# Patient Record
Sex: Female | Born: 1970 | Race: White | Hispanic: No | Marital: Married | Smoking: Never smoker
Health system: Southern US, Community
[De-identification: ages and names within clinical notes are randomized; demographics above are authoritative.]

## PROBLEM LIST (undated history)

## (undated) DIAGNOSIS — F319 Bipolar disorder, unspecified: Secondary | ICD-10-CM

## (undated) DIAGNOSIS — G4700 Insomnia, unspecified: Secondary | ICD-10-CM

## (undated) DIAGNOSIS — K219 Gastro-esophageal reflux disease without esophagitis: Secondary | ICD-10-CM

## (undated) DIAGNOSIS — R609 Edema, unspecified: Secondary | ICD-10-CM

## (undated) DIAGNOSIS — R519 Headache, unspecified: Secondary | ICD-10-CM

## (undated) DIAGNOSIS — Z8489 Family history of other specified conditions: Secondary | ICD-10-CM

## (undated) HISTORY — PX: CHOLECYSTECTOMY: SHX55

## (undated) HISTORY — DX: Insomnia, unspecified: G47.00

## (undated) HISTORY — PX: TUBAL LIGATION: SHX77

---

## 2000-06-05 HISTORY — PX: PLACEMENT OF BREAST IMPLANTS: SHX6334

## 2017-03-24 ENCOUNTER — Emergency Department (HOSPITAL_COMMUNITY): Payer: PRIVATE HEALTH INSURANCE

## 2017-03-24 ENCOUNTER — Encounter (HOSPITAL_COMMUNITY): Payer: Self-pay

## 2017-03-24 ENCOUNTER — Emergency Department (HOSPITAL_COMMUNITY)
Admission: EM | Admit: 2017-03-24 | Discharge: 2017-03-24 | Disposition: A | Discharge status: Home / Self Care | Payer: PRIVATE HEALTH INSURANCE | Attending: Emergency Medicine | Admitting: Emergency Medicine

## 2017-03-24 DIAGNOSIS — R079 Chest pain, unspecified: Secondary | ICD-10-CM | POA: Diagnosis present

## 2017-03-24 DIAGNOSIS — R531 Weakness: Secondary | ICD-10-CM | POA: Diagnosis not present

## 2017-03-24 DIAGNOSIS — R51 Headache: Secondary | ICD-10-CM | POA: Insufficient documentation

## 2017-03-24 DIAGNOSIS — Z79899 Other long term (current) drug therapy: Secondary | ICD-10-CM | POA: Diagnosis not present

## 2017-03-24 DIAGNOSIS — F419 Anxiety disorder, unspecified: Secondary | ICD-10-CM | POA: Insufficient documentation

## 2017-03-24 DIAGNOSIS — R0602 Shortness of breath: Secondary | ICD-10-CM | POA: Insufficient documentation

## 2017-03-24 HISTORY — DX: Bipolar disorder, unspecified: F31.9

## 2017-03-24 LAB — D-DIMER, QUANTITATIVE (NOT AT ARMC): D DIMER QUANT: 0.32 ug{FEU}/mL (ref 0.00–0.50)

## 2017-03-24 LAB — I-STAT TROPONIN, ED
TROPONIN I, POC: 0 ng/mL (ref 0.00–0.08)
Troponin i, poc: 0 ng/mL (ref 0.00–0.08)

## 2017-03-24 LAB — BASIC METABOLIC PANEL
ANION GAP: 10 (ref 5–15)
BUN: 10 mg/dL (ref 6–20)
CALCIUM: 8.3 mg/dL — AB (ref 8.9–10.3)
CO2: 25 mmol/L (ref 22–32)
Chloride: 100 mmol/L — ABNORMAL LOW (ref 101–111)
Creatinine, Ser: 0.9 mg/dL (ref 0.44–1.00)
GLUCOSE: 97 mg/dL (ref 65–99)
POTASSIUM: 3.5 mmol/L (ref 3.5–5.1)
SODIUM: 135 mmol/L (ref 135–145)

## 2017-03-24 LAB — CBC
HEMATOCRIT: 41.3 % (ref 36.0–46.0)
HEMOGLOBIN: 14 g/dL (ref 12.0–15.0)
MCH: 30.4 pg (ref 26.0–34.0)
MCHC: 33.9 g/dL (ref 30.0–36.0)
MCV: 89.6 fL (ref 78.0–100.0)
Platelets: 347 10*3/uL (ref 150–400)
RBC: 4.61 MIL/uL (ref 3.87–5.11)
RDW: 13 % (ref 11.5–15.5)
WBC: 7.4 10*3/uL (ref 4.0–10.5)

## 2017-03-24 MED ORDER — RANITIDINE HCL 150 MG PO TABS: 150.0000 mg | ORAL_TABLET | Freq: Two times a day (BID) | ORAL | 0 refills | Status: DC

## 2017-03-24 MED ORDER — GI COCKTAIL ~~LOC~~
30.0000 mL | Freq: Once | ORAL | Status: AC
  Administered 2017-03-24: 30 mL via ORAL
  Filled 2017-03-24: qty 30

## 2017-03-24 NOTE — ED Provider Notes (Signed)
[facility] EMERGENCY DEPARTMENT Provider Note   CSN: [id number] Arrival date & time: 03/24/17  1155     History   Chief Complaint Chief Complaint  Patient presents with  . Chest Pain    HPI Michaela Patton is a 46 y.o. female.  46yo F w/ PMH including HLD who p/w chest pain. Patient states that for the past one week she has been having intermittent problems with indigestion which she describes as a constant burning that seems worse in the morning after she gets to work, not associated with any nausea or vomiting. She has taken Tums multiple times without much relief. Todayshe woke up with a headache and later had a gradual onset of central chest pain that she describes as sharp, relatively constant, radiating up into her neck and occasionally into her scapula. The pain lasted about an hour and then resolved. It then started again just before lunch time which is what prompted her to come in. She reports some mild associated shortness of breath and generalized weakness. She states that the pain is not severe right now but she just feels sore and doesn't feel right. She denies any abdominal pain. No cough, cold, or recent illness. She describes her current discomfort as a fullness sore achy feeling. Family history notable for grandparents with heart disease. She denies anysmoking or alcohol use.   The history is provided by the patient.    Past Medical History:  Diagnosis Date  . Bipolar 1 disorder ([place])     There are no active problems to display for this patient.   Past Surgical History:  Procedure Laterality Date  . CHOLECYSTECTOMY    . TUBAL LIGATION      OB History    No data available       Home Medications    Prior to Admission medications   Medication Sig Start Date End Date Taking? Authorizing Provider  atorvastatin (LIPITOR) 80 MG tablet Take 80 mg by mouth daily. 04/25/16  Yes [provider]  dicyclomine (BENTYL) 20 MG tablet Take  20 mg by mouth every 8 (eight) hours as needed for spasms.  06/15/16  Yes [provider]  furosemide (LASIX) 40 MG tablet Take 40 mg by mouth 2 (two) times daily. 02/28/17  Yes [provider]  LORazepam (ATIVAN) 2 MG tablet Take 2 mg by mouth 2 (two) times daily. 01/19/17  Yes [provider]  potassium chloride SA (KLOR-CON M20) 20 MEQ tablet Take 20 mEq by mouth 3 (three) times daily. 05/19/16  Yes [provider]  QUEtiapine (SEROQUEL) 200 MG tablet Take 600 mg by mouth at bedtime. 01/13/17  Yes [provider]  temazepam (RESTORIL) 30 MG capsule Take 30 mg by mouth at bedtime. 02/16/17  Yes [provider]  Vitamin D, Ergocalciferol, (DRISDOL) 50000 units CAPS capsule Take 50,000 Units by mouth See admin instructions. Take 1 capsule (50,000 units) by mouth every Michaela Patton. 02/08/17  Yes [provider]  ranitidine (ZANTAC) 150 MG tablet Take 1 tablet (150 mg total) by mouth 2 (two) times daily. 03/24/17   Jermani Eberlein, Wenda Overland, MD    Family History History reviewed. No pertinent family history.  Social History Social History  Substance Use Topics  . Smoking status: Never Smoker  . Smokeless tobacco: Never Used  . Alcohol use Not on file     Allergies   Ciprofloxacin; Codeine; Penicillins; and Sulfa antibiotics   Review of Systems Review of Systems All other systems reviewed  and are negative except that which was mentioned in HPI   Physical Exam Updated Vital Signs BP 115/82   Pulse 80   Temp 98.2 F (36.8 C) (Oral)   Resp 13   Ht 5\' 4"  (1.626 m)   Wt 85.7 kg (189 lb)   LMP 03/05/2017 (Approximate) Comment: tubal ligation  SpO2 97%   BMI 32.44 kg/m   Physical Exam  Constitutional: She is oriented to person, place, and time. She appears well-developed and well-nourished. No distress.  HENT:  Head: Normocephalic and atraumatic.  Moist mucous membranes  Eyes: Pupils are equal, round, and reactive to light.  Conjunctivae are normal.  Neck: Neck supple.  Cardiovascular: Normal rate, regular rhythm and normal heart sounds.   No murmur heard. Pulmonary/Chest: Effort normal and breath sounds normal.  Abdominal: Soft. Bowel sounds are normal. She exhibits no distension. There is no tenderness.  Musculoskeletal: She exhibits no edema.  Neurological: She is alert and oriented to person, place, and time.  Fluent speech  Skin: Skin is warm and dry.  Psychiatric: Judgment normal.  Mildly anxious  Nursing note and vitals reviewed.    ED Treatments / Results  Labs (all labs ordered are listed, but only abnormal results are displayed) Labs Reviewed  BASIC METABOLIC PANEL - Abnormal; Notable for the following:       Result Value   Chloride 100 (*)    Calcium 8.3 (*)    All other components within normal limits  CBC  D-DIMER, QUANTITATIVE (NOT AT [facility])  I-STAT TROPONIN, ED  I-STAT TROPONIN, ED    EKG  EKG Interpretation  Date/Time:  Saturday March 24 2017 12:02:14 EDT Ventricular Rate:  101 PR Interval:  120 QRS Duration: 82 QT Interval:  358 QTC Calculation: 464 R Axis:   39 Text Interpretation:  Sinus tachycardia Otherwise normal ECG No previous ECGs available Confirmed by Theotis Burrow [phone]) on 03/24/2017 1:28:57 PM       Radiology Dg Chest 2 View  Result Date: 03/24/2017 CLINICAL DATA:  Pain mimicking acid reflux, for 3 weeks. Headache, and chest pain. EXAM: CHEST  2 VIEW COMPARISON:  None. FINDINGS: The heart size and mediastinal contours are within normal limits. Both lungs are clear. The visualized skeletal structures are unremarkable. IMPRESSION: No active cardiopulmonary disease. Electronically Signed   By: Staci Righter M.D.   On: 03/24/2017 12:55    Procedures Procedures (including critical care time)  Medications Ordered in ED Medications  gi cocktail (Maalox,Lidocaine,Donnatal) (30 mLs Oral Given 03/24/17 0000)     Initial Impression / Assessment and Plan  / ED Course  I have reviewed the triage vital signs and the nursing notes.  Pertinent labs & imaging results that were available during my care of the patient were reviewed by me and considered in my medical decision making (see chart for details).     PT w/ 1 week of central burning, chest pain and today sharp chest pain. Her symptoms have been nonexertional. On exam, she was nontoxic and in no acute distress. Vital signs unremarkable. Her EKG showed no evidence of acute ischemia. She had negative serial troponins, normal d-dimer making PE extremely unlikely especially given that she has no risk factors for PE. CXR normal. No widened mediastinum and pain was gradually worsening, now improved without intervention, thus I doubt dissection. Her HEART score is 1 to 2, thus I feel she is appropriate for close outpatient f/u and consideration of stress testing. ecause of her week of symptoms leading up  to today, I did recommend that she start Zantac Extensively reviewed return precautions including any sudden change in symptoms or new symptoms associated with her pain, or for any exertional symptoms. Patient voiced understanding and was discharged in satisfactory condition.  Final Clinical Impressions(s) / ED Diagnoses   Final diagnoses:  Chest pain, unspecified type    New Prescriptions New Prescriptions   RANITIDINE (ZANTAC) 150 MG TABLET    Take 1 tablet (150 mg total) by mouth 2 (two) times daily.     Selinda Korzeniewski, Wenda Overland, MD 03/24/17 [phone]

## 2017-03-24 NOTE — ED Triage Notes (Signed)
Pt presents with indigestion x 1 week and mid-sternal chest pressure that began suddenly today.  Pt reports pain is constant, radiates to mid-scapular area and into her neck.  +shortness of breath, reports weakness

## 2017-11-18 IMAGING — DX DG CHEST 2V
2 series · 2 of 2 positions shown · non-contrast
Comparison: None.

CLINICAL DATA: Pain mimicking acid reflux, for 3 weeks. Headache,
and chest pain.

EXAM:
CHEST  2 VIEW

[w chest pa]
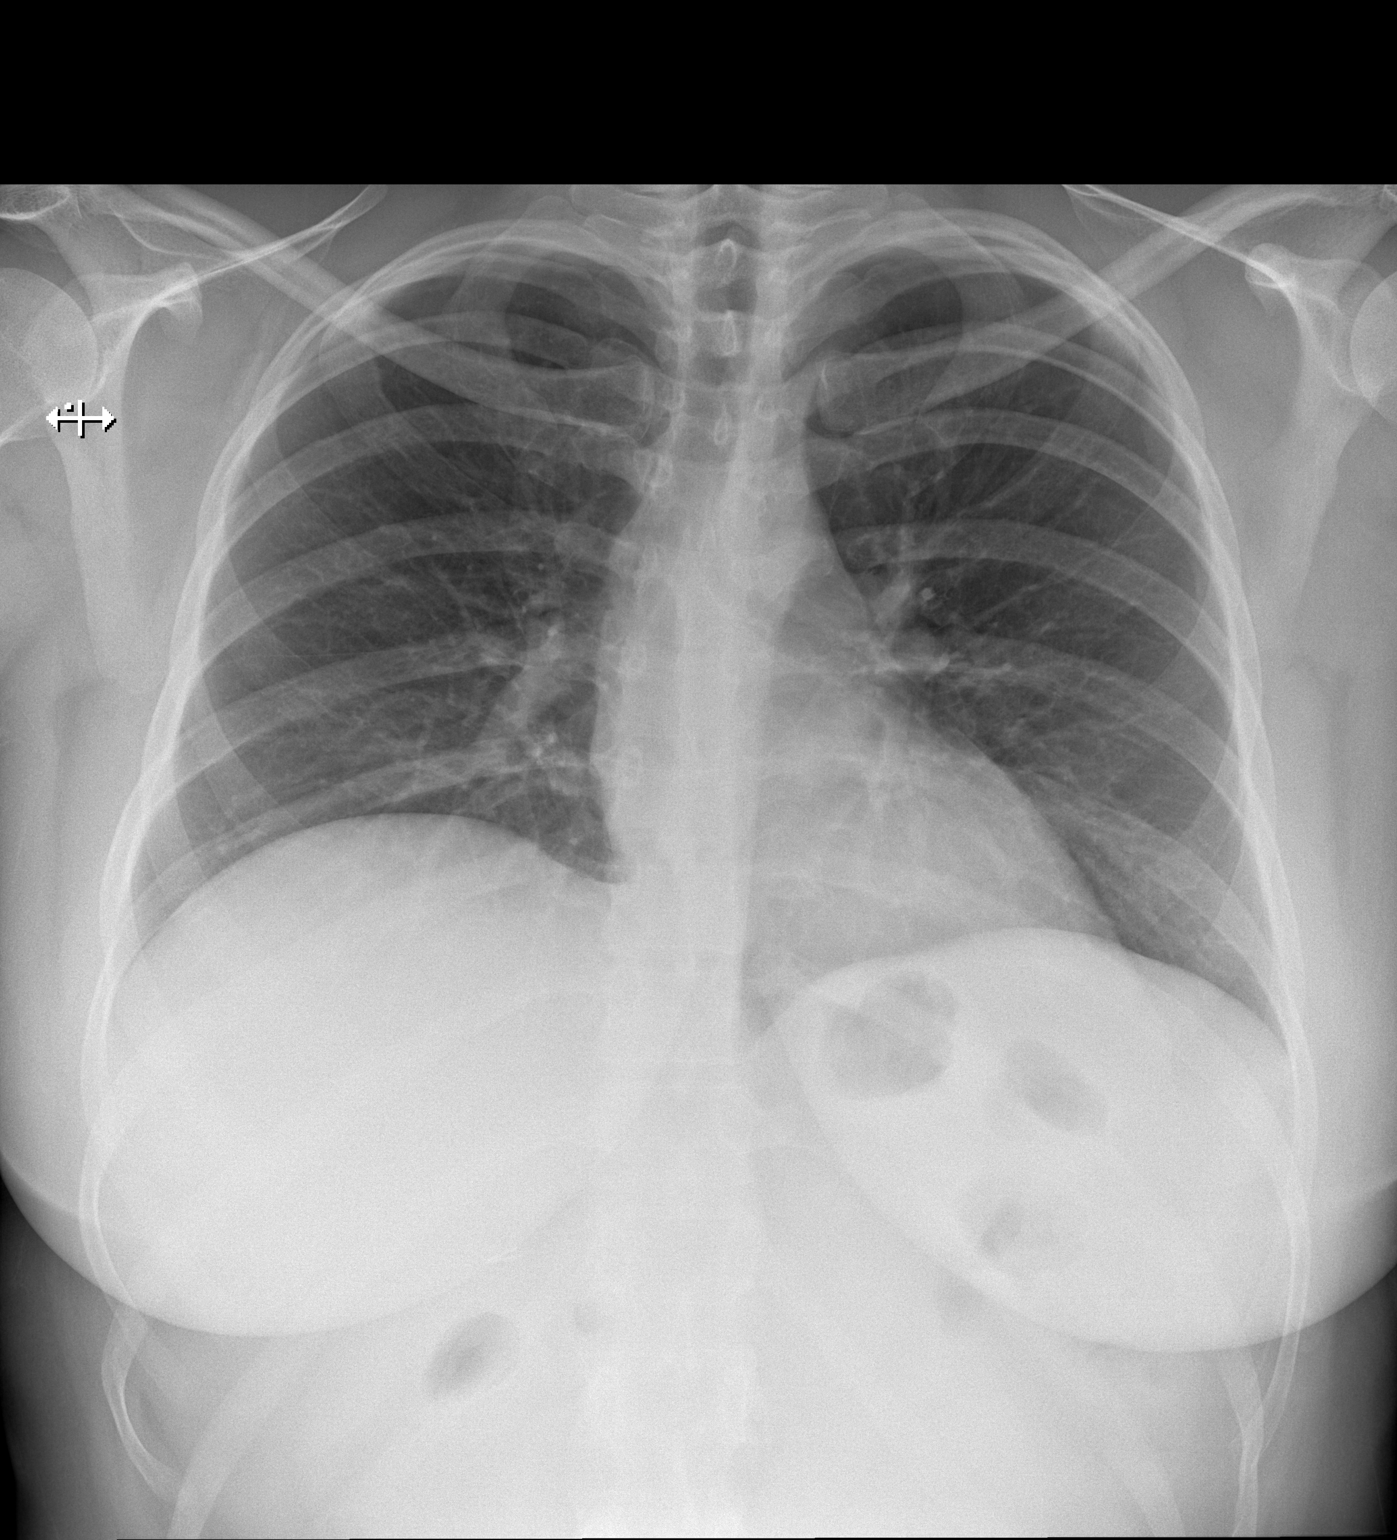

[w chest lat]
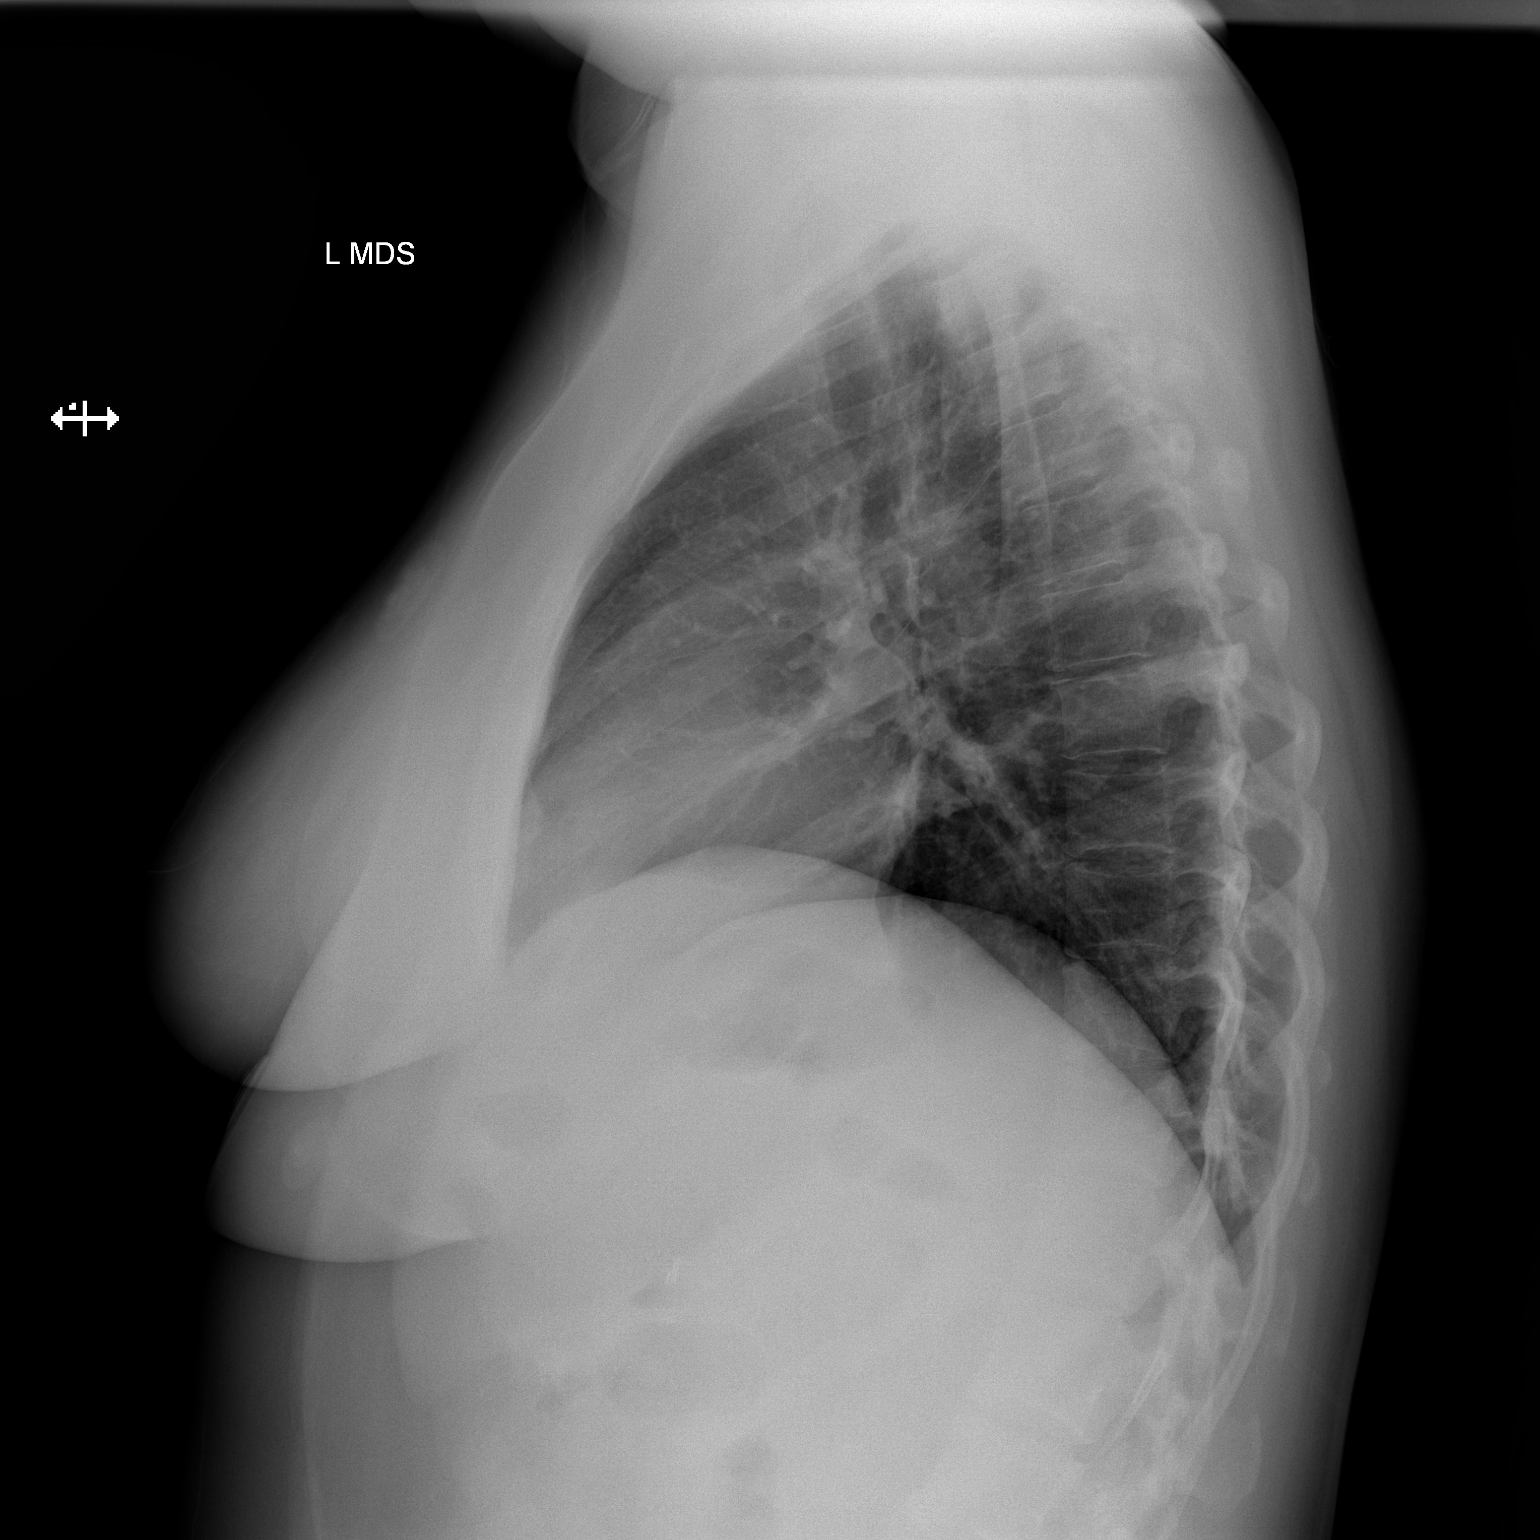

[2 of 2 positions shown; findings below may reference images not displayed]

FINDINGS: The heart size and mediastinal contours are within normal limits.
Both lungs are clear. The visualized skeletal structures are
unremarkable.
IMPRESSION: No active cardiopulmonary disease.

## 2018-12-18 ENCOUNTER — Telehealth: Payer: Self-pay | Admitting: *Deleted

## 2018-12-18 NOTE — Telephone Encounter (Signed)
Called patient back to schedule an appointment.

## 2019-01-09 ENCOUNTER — Encounter: Payer: Self-pay | Admitting: Obstetrics & Gynecology

## 2019-01-09 ENCOUNTER — Other Ambulatory Visit: Payer: Self-pay

## 2019-01-09 ENCOUNTER — Ambulatory Visit (INDEPENDENT_AMBULATORY_CARE_PROVIDER_SITE_OTHER): Payer: PRIVATE HEALTH INSURANCE | Admitting: Obstetrics & Gynecology

## 2019-01-09 VITALS — BP 124/91 | HR 93 | Ht 64.0 in | Wt 195.0 lb

## 2019-01-09 DIAGNOSIS — Z1151 Encounter for screening for human papillomavirus (HPV): Secondary | ICD-10-CM | POA: Diagnosis not present

## 2019-01-09 DIAGNOSIS — Z124 Encounter for screening for malignant neoplasm of cervix: Secondary | ICD-10-CM

## 2019-01-09 DIAGNOSIS — Z01419 Encounter for gynecological examination (general) (routine) without abnormal findings: Secondary | ICD-10-CM | POA: Diagnosis not present

## 2019-01-09 DIAGNOSIS — N920 Excessive and frequent menstruation with regular cycle: Secondary | ICD-10-CM

## 2019-01-09 NOTE — Progress Notes (Signed)
Subjective:    Michaela Patton is a 48 y.o. married P2 (55 and 32 yo kids)female who presents for an annual exam. She reports progressively painful periods for the last year. Bleeding has become much heavier as well, has to wear pads and tampon at the same time. The patient is sexually active. GYN screening history: last pap: was normal. The patient wears seatbelts: yes. The patient participates in regular exercise: no. Has the patient ever been transfused or tattooed?: yes. The patient reports that there is not domestic violence in her life.   Menstrual History: OB History    Gravida  2   Para  2   Term  2   Preterm      AB      Living  2     SAB      TAB      Ectopic      Multiple      Live Births              Menarche age: 70 Patient's last menstrual period was 12/19/2018 (approximate).    The following portions of the patient's history were reviewed and updated as appropriate: allergies, current medications, past family history, past medical history, past social history, past surgical history and problem list.  Review of Systems Pertinent items are noted in HPI.   Sees Dr. Augusto Gamble for thyroid disease, last checked 5/20 Married for 24 years S/p BTL at age 20 yo Works for the [employer] office [employer]- + breast cancer([place])  in her 22 yo sister, negative BRCA,  Mammogram UTD  Objective:    BP (!) 124/91   Pulse 93   Ht _0  (1.626 m)   Wt 195 lb (88.5 kg)   LMP 12/19/2018 (Approximate)   BMI 33.47 kg/m   General Appearance:    Alert, cooperative, no distress, appears stated age  Head:    Normocephalic, without obvious abnormality, atraumatic  Eyes:    PERRL, conjunctiva/corneas clear, EOM's intact, fundi    benign, both eyes  Ears:    Normal TM's and external ear canals, both ears  Nose:   Nares normal, septum midline, mucosa normal, no drainage    or sinus tenderness  Throat:   Lips, mucosa, and tongue normal; teeth and gums normal  Neck:    Supple, symmetrical, trachea midline, no adenopathy;    thyroid:  no enlargement/tenderness/nodules; no carotid   bruit or JVD  Back:     Symmetric, no curvature, ROM normal, no CVA tenderness  Lungs:     Clear to auscultation bilaterally, respirations unlabored  Chest Wall:    No tenderness or deformity   Heart:    Regular rate and rhythm, S1 and S2 normal, no murmur, rub   or gallop  Breast Exam:    No tenderness, masses, or nipple abnormality  Abdomen:     Soft, non-tender, bowel sounds active all four quadrants,    no masses, no organomegaly  Genitalia:    Normal female without lesion, discharge or tenderness, normal appearing cervix, 15 week size uterus c/w fibroids     Extremities:   Extremities normal, atraumatic, no cyanosis or edema  Pulses:   2+ and symmetric all extremities  Skin:   Skin color, texture, turgor normal, no rashes or lesions  Lymph nodes:   Cervical, supraclavicular, and axillary nodes normal  Neurologic:   CNII-XII intact, normal strength, sensation and reflexes    throughout  .    Assessment:  Healthy female exam.   Menorrhagia, uterine enlargement   Plan:     Thin prep Pap smear. with cotesting Gyn u/s Check CBC Needs appt after u/s results available

## 2019-01-09 NOTE — Progress Notes (Signed)
Pt thinks pap was 2 years ago- wants pap today Heavy, painful periods Pt states mammogram is up to date

## 2019-01-10 LAB — CBC
HCT: 39.4 % (ref 35.0–45.0)
Hemoglobin: 13.1 g/dL (ref 11.7–15.5)
MCH: 28.3 pg (ref 27.0–33.0)
MCHC: 33.2 g/dL (ref 32.0–36.0)
MCV: 85.1 fL (ref 80.0–100.0)
MPV: 9 fL (ref 7.5–12.5)
Platelets: 438 10*3/uL — ABNORMAL HIGH (ref 140–400)
RBC: 4.63 10*6/uL (ref 3.80–5.10)
RDW: 14.3 % (ref 11.0–15.0)
WBC: 8.5 10*3/uL (ref 3.8–10.8)

## 2019-01-13 ENCOUNTER — Other Ambulatory Visit: Payer: PRIVATE HEALTH INSURANCE

## 2019-01-14 LAB — CYTOLOGY - PAP: HPV: NOT DETECTED

## 2019-01-23 ENCOUNTER — Other Ambulatory Visit: Payer: Self-pay

## 2019-01-23 ENCOUNTER — Ambulatory Visit (INDEPENDENT_AMBULATORY_CARE_PROVIDER_SITE_OTHER): Payer: PRIVATE HEALTH INSURANCE

## 2019-01-23 DIAGNOSIS — N920 Excessive and frequent menstruation with regular cycle: Secondary | ICD-10-CM | POA: Diagnosis not present

## 2019-01-27 ENCOUNTER — Telehealth (INDEPENDENT_AMBULATORY_CARE_PROVIDER_SITE_OTHER): Payer: PRIVATE HEALTH INSURANCE | Admitting: Obstetrics & Gynecology

## 2019-01-27 ENCOUNTER — Other Ambulatory Visit: Payer: Self-pay

## 2019-01-27 ENCOUNTER — Encounter: Payer: Self-pay | Admitting: Obstetrics & Gynecology

## 2019-01-27 DIAGNOSIS — N946 Dysmenorrhea, unspecified: Secondary | ICD-10-CM | POA: Diagnosis not present

## 2019-01-27 DIAGNOSIS — N920 Excessive and frequent menstruation with regular cycle: Secondary | ICD-10-CM | POA: Diagnosis not present

## 2019-01-27 NOTE — Progress Notes (Signed)
TELEHEALTH GYNECOLOGY VIRTUAL VIDEO VISIT ENCOUNTER NOTE  Provider location: [facility] at [place]   I connected with Michaela Patton on 01/27/19 at 11:15 AM EDT by MyChart Video Encounter at home and verified that I am speaking with the correct person using two identifiers.   I discussed the limitations, risks, security and privacy concerns of performing an evaluation and management service virtually and the availability of in person appointments. I also discussed with the patient that there may be a patient responsible charge related to this service. The patient expressed understanding and agreed to proceed.   History:  Michaela Patton is a 48 y.o. G36P2002 female being evaluated today for followup after u/s done for heavy and painful periods. She does have some pelvic pain without her period, about a 5 out 10. With her period the pain is a 10 out of 10. IBU helps some, if she takes 800 mg every 4 hours.      Past Medical History:  Diagnosis Date  . Bipolar 1 disorder ([place])   . Insomnia    Past Surgical History:  Procedure Laterality Date  . CHOLECYSTECTOMY    . TUBAL LIGATION     The following portions of the patient's history were reviewed and updated as appropriate: allergies, current medications, past family history, past medical history, past social history, past surgical history and problem list.    Review of Systems:  Pertinent items noted in HPI and remainder of comprehensive ROS otherwise negative.  Physical Exam:   General:  Alert, oriented and cooperative. Patient appears to be in no acute distress.  Mental Status: Normal mood and affect. Normal behavior. Normal judgment and thought content.   Respiratory: Normal respiratory effort, no problems with respiration noted  Rest of physical exam deferred due to type of encounter  Labs and Imaging No results found for this or any previous visit (from the past 336 hour(s)). US Pelvis Transvaginal  Non-ob (tv Only)  Result Date: 01/23/2019 CLINICAL DATA:  Menorrhagia with regular cycle EXAM: ULTRASOUND PELVIS TRANSVAGINAL TECHNIQUE: Transvaginal ultrasound examination of the pelvis was performed including evaluation of the uterus, ovaries, adnexal regions, and pelvic cul-de-sac. COMPARISON:  None FINDINGS: Uterus Measurements: 11.4 x 6.6 x 7.2 cm = volume: 283 mL. Anteverted. Heterogeneous myometrium with ill definition of the basilar layer of the endometrium, cannot exclude adenomyosis. Small anterior wall intramural leiomyoma 1.5 x 1.5 x 1.5 cm. No additional discrete uterine masses Endometrium Thickness: 7 mm.  No endometrial fluid or focal abnormalities Right ovary Measurements: 2.3 x 3.0 x 2.0 cm = volume: 7 mL. Dominant follicle without mass Left ovary Not visualized on either transabdominal or endovaginal imaging, likely obscured by bowel Other findings:  No free pelvic fluid.  No adnexal masses. IMPRESSION: Single small anterior wall intramural leiomyoma of the uterus 1.5 cm diameter. Heterogeneous myometrium with ill defined junction with the endometrial complex, cannot exclude adenomyosis. Nonvisualization of LEFT ovary. Remainder of exam unremarkable. Electronically Signed   By: Lavonia Dana M.D.   On: 01/23/2019 17:33       Assessment and Plan:     Menorrhagia, dysmenorrhea     adenomyosis and small fibroid- I offered her OCPs but she declines this. She would prefer a hysterectomy. She had 2 vaginal deliveries.  I will send Michaela Patton a message to schedule a TVH/BS. 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   I discussed  the assessment and treatment plan with the patient. The patient was provided an opportunity to ask questions and all were answered. The patient agreed with the plan and demonstrated an understanding of the instructions.   The patient was advised to call back or seek an in-person  evaluation/go to the ED if the symptoms worsen or if the condition fails to improve as anticipated.  I provided 11 minutes of face-to-face time during this encounter.   Michaela Filbert, MD [facility], [place]

## 2019-02-20 NOTE — Progress Notes (Signed)
[id number] - [place], [place] [address] [address] Phone: [phone] Fax: [phone]      Your procedure is scheduled on September 22nd.  Report to [facility] Main Entrance "A" at 6:45 A.M., and check in at the Admitting office.  Call this number if you have problems the morning of surgery:  [phone]  Call [phone] if you have any questions prior to your surgery date Monday-Friday 8am-4pm    Remember:  Do not eat after midnight the night before your surgery  You may drink clear liquids until 5:45 AM the morning of your surgery.   Clear liquids allowed are: Water, Non-Citrus Juices (without pulp), Carbonated Beverages, Clear Tea, Black Coffee Only, and Gatorade    Take these medicines the morning of surgery with A SIP OF WATER   Lorazepam (Ativan)  Ranitidine (Zantac)  7 days prior to surgery STOP taking any Aspirin (unless otherwise instructed by your surgeon), Aleve, Naproxen, Ibuprofen, Motrin, Advil, Goody's, BC's, all herbal medications, fish oil, and all vitamins.    The Morning of Surgery  Do not wear jewelry, make-up or nail polish.  Do not wear lotions, powders, perfumes, or deodorant  Do not shave 48 hours prior to surgery.    Do not bring valuables to the hospital.  [facility] is not responsible for any belongings or valuables.  If you are a smoker, DO NOT Smoke 24 hours prior to surgery IF you wear a CPAP at night please bring your mask, tubing, and machine the morning of surgery   Remember that you must have someone to transport you home after your surgery, and remain with you for 24 hours if you are discharged the same day.   Contacts, glasses, hearing aids, dentures or bridgework may not be worn into surgery.    Leave your suitcase in the car.  After surgery it may be brought to your room.  For patients admitted to the hospital, discharge time will be determined by your treatment team.  Patients  discharged the day of surgery will not be allowed to drive home.    Special instructions:   [place]- Preparing For Surgery  Before surgery, you can play an important role. Because skin is not sterile, your skin needs to be as free of germs as possible. You can reduce the number of germs on your skin by washing with CHG (chlorahexidine gluconate) Soap before surgery.  CHG is an antiseptic cleaner which kills germs and bonds with the skin to continue killing germs even after washing.    Oral Hygiene is also important to reduce your risk of infection.  Remember - BRUSH YOUR TEETH THE MORNING OF SURGERY WITH YOUR REGULAR TOOTHPASTE  Please do not use if you have an allergy to CHG or antibacterial soaps. If your skin becomes reddened/irritated stop using the CHG.  Do not shave (including legs and underarms) for at least 48 hours prior to first CHG shower. It is OK to shave your face.  Please follow these instructions carefully.   1. Shower the NIGHT BEFORE SURGERY and the MORNING OF SURGERY with CHG Soap.   2. If you chose to wash your hair, wash your hair first as usual with your normal shampoo.  3. After you shampoo, rinse your hair and body thoroughly to remove the shampoo.  4. Use CHG as you would any other liquid soap. You can apply CHG directly to the skin and wash gently with  a scrungie or a clean washcloth.   5. Apply the CHG Soap to your body ONLY FROM THE NECK DOWN.  Do not use on open wounds or open sores. Avoid contact with your eyes, ears, mouth and genitals (private parts). Wash Face and genitals (private parts)  with your normal soap.   6. Wash thoroughly, paying special attention to the area where your surgery will be performed.  7. Thoroughly rinse your body with warm water from the neck down.  8. DO NOT shower/wash with your normal soap after using and rinsing off the CHG Soap.  9. Pat yourself dry with a CLEAN TOWEL.  10. Wear CLEAN PAJAMAS to bed the night before  surgery, wear comfortable clothes the morning of surgery  11. Place CLEAN SHEETS on your bed the night of your first shower and DO NOT SLEEP WITH PETS.    Day of Surgery:  Do not apply any deodorants/lotions. Please shower the morning of surgery with the CHG soap  Please wear clean clothes to the hospital/surgery center.   Remember to brush your teeth WITH YOUR REGULAR TOOTHPASTE.   Please read over the following fact sheets that you were given.

## 2019-02-21 ENCOUNTER — Other Ambulatory Visit (HOSPITAL_COMMUNITY): Payer: PRIVATE HEALTH INSURANCE

## 2019-02-21 ENCOUNTER — Other Ambulatory Visit: Payer: Self-pay

## 2019-02-21 ENCOUNTER — Encounter (HOSPITAL_COMMUNITY)
Admission: RE | Admit: 2019-02-21 | Discharge: 2019-02-21 | Disposition: A | Discharge status: Home / Self Care | Payer: BC Managed Care – PPO | Source: Ambulatory Visit | Attending: Obstetrics & Gynecology | Admitting: Obstetrics & Gynecology

## 2019-02-21 ENCOUNTER — Other Ambulatory Visit (HOSPITAL_COMMUNITY)
Admission: RE | Admit: 2019-02-21 | Discharge: 2019-02-21 | Disposition: A | Discharge status: Home / Self Care | Payer: BC Managed Care – PPO | Source: Ambulatory Visit | Attending: Obstetrics & Gynecology | Admitting: Obstetrics & Gynecology

## 2019-02-21 ENCOUNTER — Encounter (HOSPITAL_COMMUNITY): Payer: Self-pay

## 2019-02-21 DIAGNOSIS — Z20828 Contact with and (suspected) exposure to other viral communicable diseases: Secondary | ICD-10-CM | POA: Insufficient documentation

## 2019-02-21 DIAGNOSIS — Z01812 Encounter for preprocedural laboratory examination: Secondary | ICD-10-CM | POA: Diagnosis not present

## 2019-02-21 HISTORY — DX: Edema, unspecified: R60.9

## 2019-02-21 HISTORY — DX: Headache, unspecified: R51.9

## 2019-02-21 HISTORY — DX: Gastro-esophageal reflux disease without esophagitis: K21.9

## 2019-02-21 HISTORY — DX: Family history of other specified conditions: Z84.89

## 2019-02-21 LAB — CBC
HCT: 41.5 % (ref 36.0–46.0)
Hemoglobin: 13.9 g/dL (ref 12.0–15.0)
MCH: 29.1 pg (ref 26.0–34.0)
MCHC: 33.5 g/dL (ref 30.0–36.0)
MCV: 87 fL (ref 80.0–100.0)
Platelets: 436 10*3/uL — ABNORMAL HIGH (ref 150–400)
RBC: 4.77 MIL/uL (ref 3.87–5.11)
RDW: 13.1 % (ref 11.5–15.5)
WBC: 6.9 10*3/uL (ref 4.0–10.5)
nRBC: 0 % (ref 0.0–0.2)

## 2019-02-21 LAB — TYPE AND SCREEN
ABO/RH(D): O POS
Antibody Screen: NEGATIVE

## 2019-02-21 LAB — ABO/RH: ABO/RH(D): O POS

## 2019-02-21 NOTE — Progress Notes (Addendum)
[id number] - [place], [address] [address] Phone: [phone] Fax: [phone]      Your procedure is scheduled on September 22nd.  Report to [facility] Main Entrance "A" at 05:30 A.M., and check in at the Admitting office.  Call this number if you have problems the morning of surgery:  [phone]  Call [phone] if you have any questions prior to your surgery date Monday-Friday 8am-4pm    Remember:  Do not eat after midnight the night before your surgery  You may drink clear liquids until 04:30 AM the morning of your surgery.   Clear liquids allowed are: Water, Non-Citrus Juices (without pulp), Carbonated Beverages, Clear Tea, Black Coffee Only, and Gatorade    Take these medicines the morning of surgery with A SIP OF WATER   Lorazepam (Ativan)   7 days prior to surgery STOP taking any Aspirin (unless otherwise instructed by your surgeon), Aleve, Naproxen, Ibuprofen, Motrin, Advil, Goody's, BC's, all herbal medications, fish oil, and all vitamins.    The Morning of Surgery  Do not wear jewelry, make-up or nail polish.  Do not wear lotions, powders, perfumes, or deodorant  Do not shave 48 hours prior to surgery.    Do not bring valuables to the hospital.  [facility] is not responsible for any belongings or valuables.  If you are a smoker, DO NOT Smoke 24 hours prior to surgery IF you wear a CPAP at night please bring your mask, tubing, and machine the morning of surgery   Remember that you must have someone to transport you home after your surgery, and remain with you for 24 hours if you are discharged the same day.   Contacts, glasses, hearing aids, dentures or bridgework may not be worn into surgery.    Leave your suitcase in the car.  After surgery it may be brought to your room.  For patients admitted to the hospital, discharge time will be determined by your treatment team.  Patients discharged the day of  surgery will not be allowed to drive home.    Special instructions:   [facility]- Preparing For Surgery  Before surgery, you can play an important role. Because skin is not sterile, your skin needs to be as free of germs as possible. You can reduce the number of germs on your skin by washing with CHG (chlorahexidine gluconate) Soap before surgery.  CHG is an antiseptic cleaner which kills germs and bonds with the skin to continue killing germs even after washing.    Oral Hygiene is also important to reduce your risk of infection.  Remember - BRUSH YOUR TEETH THE MORNING OF SURGERY WITH YOUR REGULAR TOOTHPASTE  Please do not use if you have an allergy to CHG or antibacterial soaps. If your skin becomes reddened/irritated stop using the CHG.  Do not shave (including legs and underarms) for at least 48 hours prior to first CHG shower. It is OK to shave your face.  Please follow these instructions carefully.   1. Shower the NIGHT BEFORE SURGERY and the MORNING OF SURGERY with CHG Soap.   2. If you chose to wash your hair, wash your hair first as usual with your normal shampoo.  3. After you shampoo, rinse your hair and body thoroughly to remove the shampoo.  4. Use CHG as you would any other liquid soap. You can apply CHG directly to the skin and wash gently with a scrungie  or a clean washcloth.   5. Apply the CHG Soap to your body ONLY FROM THE NECK DOWN.  Do not use on open wounds or open sores. Avoid contact with your eyes, ears, mouth and genitals (private parts). Wash Face and genitals (private parts)  with your normal soap.   6. Wash thoroughly, paying special attention to the area where your surgery will be performed.  7. Thoroughly rinse your body with warm water from the neck down.  8. DO NOT shower/wash with your normal soap after using and rinsing off the CHG Soap.  9. Pat yourself dry with a CLEAN TOWEL.  10. Wear CLEAN PAJAMAS to bed the night before surgery, wear  comfortable clothes the morning of surgery  11. Place CLEAN SHEETS on your bed the night of your first shower and DO NOT SLEEP WITH PETS.    Day of Surgery:  Do not apply any deodorants/lotions. Please shower the morning of surgery with the CHG soap  Please wear clean clothes to the hospital/surgery center.   Remember to brush your teeth WITH YOUR REGULAR TOOTHPASTE.   Please read over the following fact sheets that you were given.

## 2019-02-21 NOTE — Progress Notes (Signed)
PCP: Dr. Earlean [place] Cardiologist: denies   EKG: n/a CXR: n/a ECHO: denies Stress Test: denies Cardiac Cath: denies  Pt takes Furosemide daily for edema in feet, no medical history of HTN.  Called James PA-C with anesthesia regarding need for EKG--no need for EKG  Going for Covid testing today.   Patient denies shortness of breath, fever, cough, and chest pain at PAT appointment.  Patient verbalized understanding of instructions provided today at the PAT appointment.  Patient asked to review instructions at home and day of surgery.

## 2019-02-22 LAB — NOVEL CORONAVIRUS, NAA (HOSP ORDER, SEND-OUT TO REF LAB; TAT 18-24 HRS): SARS-CoV-2, NAA: NOT DETECTED

## 2019-02-24 NOTE — Anesthesia Preprocedure Evaluation (Addendum)
Anesthesia Evaluation  Patient identified by MRN, date of birth, ID band Patient awake  General Assessment Comment:Sister gets sick w anesthesia  Reviewed: Allergy & Precautions, NPO status , Patient's Chart, lab work & pertinent test results  Airway Mallampati: II  TM Distance: >3 FB Neck ROM: Full    Dental no notable dental hx. (+) Teeth Intact, Dental Advisory Given   Pulmonary neg pulmonary ROS,    Pulmonary exam normal breath sounds clear to auscultation       Cardiovascular negative cardio ROS Normal cardiovascular exam Rhythm:Regular Rate:Normal     Neuro/Psych  Headaches, Bipolar Disorder    GI/Hepatic Neg liver ROS, GERD  Medicated,  Endo/Other  negative endocrine ROS  Renal/GU negative Renal ROS     Musculoskeletal negative musculoskeletal ROS (+)   Abdominal   Peds  Hematology negative hematology ROS (+) Hgb 13.9   Anesthesia Other Findings ALL: see list  Reproductive/Obstetrics                          Anesthesia Physical Anesthesia Plan  ASA: II  Anesthesia Plan: General   Post-op Pain Management:    Induction: Intravenous  PONV Risk Score and Plan: 3 and Treatment may vary due to age or medical condition, Ondansetron, Dexamethasone and Scopolamine patch - Pre-op  Airway Management Planned: Oral ETT  Additional Equipment: None  Intra-op Plan:   Post-operative Plan: Extubation in OR  Informed Consent: I have reviewed the patients History and Physical, chart, labs and discussed the procedure including the risks, benefits and alternatives for the proposed anesthesia with the patient or authorized representative who has indicated his/her understanding and acceptance.     Dental advisory given  Plan Discussed with: CRNA and Anesthesiologist  Anesthesia Plan Comments: (GA w Dexmedetomidine 0.3 mcg/kg)       Anesthesia Quick Evaluation

## 2019-02-25 ENCOUNTER — Ambulatory Visit (HOSPITAL_COMMUNITY): Payer: BC Managed Care – PPO | Admitting: Anesthesiology

## 2019-02-25 ENCOUNTER — Encounter (HOSPITAL_COMMUNITY): Payer: Self-pay | Admitting: General Practice

## 2019-02-25 ENCOUNTER — Encounter (HOSPITAL_COMMUNITY)
Admission: RE | Disposition: A | Discharge status: Home / Self Care | Payer: Self-pay | Source: Home / Self Care | Attending: Obstetrics & Gynecology

## 2019-02-25 ENCOUNTER — Ambulatory Visit (HOSPITAL_COMMUNITY): Payer: BC Managed Care – PPO | Admitting: Physician Assistant

## 2019-02-25 ENCOUNTER — Observation Stay (HOSPITAL_COMMUNITY)
Admission: RE | Admit: 2019-02-25 | Discharge: 2019-02-26 | Disposition: A | Discharge status: Home / Self Care | Payer: BC Managed Care – PPO | Attending: Obstetrics & Gynecology | Admitting: Obstetrics & Gynecology

## 2019-02-25 ENCOUNTER — Other Ambulatory Visit: Payer: Self-pay

## 2019-02-25 DIAGNOSIS — F319 Bipolar disorder, unspecified: Secondary | ICD-10-CM | POA: Diagnosis not present

## 2019-02-25 DIAGNOSIS — Z803 Family history of malignant neoplasm of breast: Secondary | ICD-10-CM | POA: Diagnosis not present

## 2019-02-25 DIAGNOSIS — N800 Endometriosis of uterus: Secondary | ICD-10-CM | POA: Insufficient documentation

## 2019-02-25 DIAGNOSIS — N920 Excessive and frequent menstruation with regular cycle: Secondary | ICD-10-CM

## 2019-02-25 DIAGNOSIS — Z881 Allergy status to other antibiotic agents status: Secondary | ICD-10-CM | POA: Diagnosis not present

## 2019-02-25 DIAGNOSIS — N946 Dysmenorrhea, unspecified: Secondary | ICD-10-CM | POA: Insufficient documentation

## 2019-02-25 DIAGNOSIS — R51 Headache: Secondary | ICD-10-CM | POA: Diagnosis not present

## 2019-02-25 DIAGNOSIS — Z79899 Other long term (current) drug therapy: Secondary | ICD-10-CM | POA: Insufficient documentation

## 2019-02-25 DIAGNOSIS — Z885 Allergy status to narcotic agent status: Secondary | ICD-10-CM | POA: Insufficient documentation

## 2019-02-25 DIAGNOSIS — Z9889 Other specified postprocedural states: Secondary | ICD-10-CM

## 2019-02-25 DIAGNOSIS — G4700 Insomnia, unspecified: Secondary | ICD-10-CM | POA: Diagnosis not present

## 2019-02-25 DIAGNOSIS — D259 Leiomyoma of uterus, unspecified: Secondary | ICD-10-CM | POA: Insufficient documentation

## 2019-02-25 DIAGNOSIS — Z882 Allergy status to sulfonamides status: Secondary | ICD-10-CM | POA: Insufficient documentation

## 2019-02-25 DIAGNOSIS — Z9049 Acquired absence of other specified parts of digestive tract: Secondary | ICD-10-CM | POA: Diagnosis not present

## 2019-02-25 DIAGNOSIS — K219 Gastro-esophageal reflux disease without esophagitis: Secondary | ICD-10-CM | POA: Diagnosis not present

## 2019-02-25 DIAGNOSIS — Z880 Allergy status to penicillin: Secondary | ICD-10-CM | POA: Diagnosis not present

## 2019-02-25 HISTORY — PX: VAGINAL HYSTERECTOMY: SHX2639

## 2019-02-25 LAB — BASIC METABOLIC PANEL
Anion gap: 10 (ref 5–15)
BUN: 8 mg/dL (ref 6–20)
CO2: 26 mmol/L (ref 22–32)
Calcium: 8.9 mg/dL (ref 8.9–10.3)
Chloride: 101 mmol/L (ref 98–111)
Creatinine, Ser: 0.87 mg/dL (ref 0.44–1.00)
GFR calc Af Amer: >60 mL/min (ref >60)
GFR calc non Af Amer: >60 mL/min (ref >60)
Glucose, Bld: 94 mg/dL (ref 70–99)
Potassium: 3 mmol/L — ABNORMAL LOW (ref 3.5–5.1)
Sodium: 137 mmol/L (ref 135–145)

## 2019-02-25 LAB — POCT PREGNANCY, URINE: Preg Test, Ur: NEGATIVE

## 2019-02-25 SURGERY — HYSTERECTOMY, VAGINAL
Anesthesia: General | Site: Vagina | Laterality: Bilateral

## 2019-02-25 MED ORDER — SODIUM CHLORIDE (PF) 0.9 % IJ SOLN
INTRAMUSCULAR | Status: DC | PRN
  Administered 2019-02-25: 100 mL

## 2019-02-25 MED ORDER — SUGAMMADEX SODIUM 200 MG/2ML IV SOLN
INTRAVENOUS | Status: DC | PRN
  Administered 2019-02-25: 175 mg via INTRAVENOUS

## 2019-02-25 MED ORDER — IBUPROFEN 600 MG PO TABS: 600.0000 mg | ORAL_TABLET | Freq: Four times a day (QID) | ORAL | 1 refills | Status: AC | PRN

## 2019-02-25 MED ORDER — CALCIUM CARBONATE ANTACID 500 MG PO CHEW
1.0000 | CHEWABLE_TABLET | ORAL | Status: DC | PRN
  Administered 2019-02-25 – 2019-02-26 (×2): 200 mg via ORAL
  Filled 2019-02-25 (×2): qty 1

## 2019-02-25 MED ORDER — LACTATED RINGERS IV SOLN
INTRAVENOUS | Status: DC | PRN
  Administered 2019-02-25 (×2): via INTRAVENOUS

## 2019-02-25 MED ORDER — CEFAZOLIN SODIUM-DEXTROSE 2-4 GM/100ML-% IV SOLN
2.0000 g | INTRAVENOUS | Status: AC
  Administered 2019-02-25: 08:00:00 2 g via INTRAVENOUS

## 2019-02-25 MED ORDER — MIDAZOLAM HCL 5 MG/5ML IJ SOLN
INTRAMUSCULAR | Status: DC | PRN
  Administered 2019-02-25: 2 mg via INTRAVENOUS

## 2019-02-25 MED ORDER — QUETIAPINE FUMARATE 400 MG PO TABS
800.0000 mg | ORAL_TABLET | Freq: Every day | ORAL | Status: DC
  Administered 2019-02-25: 21:00:00 800 mg via ORAL
  Filled 2019-02-25: qty 2

## 2019-02-25 MED ORDER — DIPHENHYDRAMINE HCL 50 MG/ML IJ SOLN
12.5000 mg | Freq: Once | INTRAMUSCULAR | Status: AC
  Filled 2019-02-25: qty 1

## 2019-02-25 MED ORDER — LORAZEPAM 1 MG PO TABS
2.0000 mg | ORAL_TABLET | Freq: Two times a day (BID) | ORAL | Status: DC
  Administered 2019-02-25: 21:00:00 2 mg via ORAL
  Filled 2019-02-25: qty 2

## 2019-02-25 MED ORDER — ZOLPIDEM TARTRATE 5 MG PO TABS
5.0000 mg | ORAL_TABLET | Freq: Every evening | ORAL | Status: DC | PRN
  Administered 2019-02-25: 21:00:00 5 mg via ORAL
  Filled 2019-02-25: qty 1

## 2019-02-25 MED ORDER — PROPOFOL 10 MG/ML IV BOLUS
INTRAVENOUS | Status: AC
  Filled 2019-02-25: qty 20

## 2019-02-25 MED ORDER — PROPOFOL 10 MG/ML IV BOLUS
INTRAVENOUS | Status: DC | PRN
  Administered 2019-02-25: 30 mg via INTRAVENOUS
  Administered 2019-02-25: 170 mg via INTRAVENOUS

## 2019-02-25 MED ORDER — ONDANSETRON HCL 4 MG/2ML IJ SOLN
INTRAMUSCULAR | Status: AC
  Filled 2019-02-25: qty 2

## 2019-02-25 MED ORDER — ONDANSETRON HCL 4 MG/2ML IJ SOLN
INTRAMUSCULAR | Status: DC | PRN
  Administered 2019-02-25: 4 mg via INTRAVENOUS

## 2019-02-25 MED ORDER — ONDANSETRON HCL 4 MG/2ML IJ SOLN: 4.0000 mg | Freq: Four times a day (QID) | INTRAMUSCULAR | Status: DC | PRN

## 2019-02-25 MED ORDER — DEXAMETHASONE SODIUM PHOSPHATE 10 MG/ML IJ SOLN
INTRAMUSCULAR | Status: AC
  Filled 2019-02-25: qty 1

## 2019-02-25 MED ORDER — ONDANSETRON HCL 4 MG/2ML IJ SOLN: 4.0000 mg | Freq: Once | INTRAMUSCULAR | Status: DC | PRN

## 2019-02-25 MED ORDER — LIDOCAINE 2% (20 MG/ML) 5 ML SYRINGE
INTRAMUSCULAR | Status: DC | PRN
  Administered 2019-02-25: 100 mg via INTRAVENOUS

## 2019-02-25 MED ORDER — DIPHENHYDRAMINE HCL 50 MG/ML IJ SOLN
12.5000 mg | Freq: Once | INTRAMUSCULAR | Status: AC
  Administered 2019-02-25: 12.5 mg via INTRAVENOUS

## 2019-02-25 MED ORDER — CEFAZOLIN SODIUM-DEXTROSE 2-4 GM/100ML-% IV SOLN
INTRAVENOUS | Status: AC
  Filled 2019-02-25: qty 100

## 2019-02-25 MED ORDER — OXYCODONE HCL 5 MG PO TABS: 5.0000 mg | ORAL_TABLET | Freq: Once | ORAL | Status: DC | PRN

## 2019-02-25 MED ORDER — ACETAMINOPHEN 10 MG/ML IV SOLN
INTRAVENOUS | Status: AC
  Filled 2019-02-25: qty 100

## 2019-02-25 MED ORDER — DEXAMETHASONE SODIUM PHOSPHATE 10 MG/ML IJ SOLN
INTRAMUSCULAR | Status: DC | PRN
  Administered 2019-02-25: 10 mg via INTRAVENOUS

## 2019-02-25 MED ORDER — BUPIVACAINE-EPINEPHRINE 0.5% -1:200000 IJ SOLN
INTRAMUSCULAR | Status: DC | PRN
  Administered 2019-02-25: 30 mL

## 2019-02-25 MED ORDER — ONDANSETRON HCL 4 MG PO TABS: 4.0000 mg | ORAL_TABLET | Freq: Four times a day (QID) | ORAL | Status: DC | PRN

## 2019-02-25 MED ORDER — FUROSEMIDE 40 MG PO TABS: 40.0000 mg | ORAL_TABLET | Freq: Every day | ORAL | Status: DC

## 2019-02-25 MED ORDER — MIDAZOLAM HCL 2 MG/2ML IJ SOLN
INTRAMUSCULAR | Status: AC
  Filled 2019-02-25: qty 2

## 2019-02-25 MED ORDER — MEPERIDINE HCL 25 MG/ML IJ SOLN: 6.2500 mg | INTRAMUSCULAR | Status: DC | PRN

## 2019-02-25 MED ORDER — HYDROMORPHONE HCL 1 MG/ML IJ SOLN
INTRAMUSCULAR | Status: AC
  Filled 2019-02-25: qty 2

## 2019-02-25 MED ORDER — FENTANYL CITRATE (PF) 100 MCG/2ML IJ SOLN
INTRAMUSCULAR | Status: DC | PRN
  Administered 2019-02-25: 50 ug via INTRAVENOUS
  Administered 2019-02-25: 75 ug via INTRAVENOUS
  Administered 2019-02-25: 125 ug via INTRAVENOUS

## 2019-02-25 MED ORDER — ROCURONIUM BROMIDE 10 MG/ML (PF) SYRINGE
PREFILLED_SYRINGE | INTRAVENOUS | Status: DC | PRN
  Administered 2019-02-25: 10 mg via INTRAVENOUS
  Administered 2019-02-25: 50 mg via INTRAVENOUS

## 2019-02-25 MED ORDER — SODIUM CHLORIDE (PF) 0.9 % IJ SOLN
INTRAMUSCULAR | Status: AC
  Filled 2019-02-25: qty 100

## 2019-02-25 MED ORDER — DIPHENHYDRAMINE HCL 50 MG/ML IJ SOLN
INTRAMUSCULAR | Status: AC
  Administered 2019-02-25: 12.5 mg
  Filled 2019-02-25: qty 1

## 2019-02-25 MED ORDER — ROCURONIUM BROMIDE 10 MG/ML (PF) SYRINGE
PREFILLED_SYRINGE | INTRAVENOUS | Status: AC
  Filled 2019-02-25: qty 10

## 2019-02-25 MED ORDER — HEMOSTATIC AGENTS (NO CHARGE) OPTIME
TOPICAL | Status: DC | PRN
  Administered 2019-02-25: 1 via TOPICAL

## 2019-02-25 MED ORDER — LIDOCAINE 2% (20 MG/ML) 5 ML SYRINGE
INTRAMUSCULAR | Status: AC
  Filled 2019-02-25: qty 5

## 2019-02-25 MED ORDER — ACETAMINOPHEN 10 MG/ML IV SOLN
1000.0000 mg | Freq: Once | INTRAVENOUS | Status: DC | PRN
  Administered 2019-02-25: 1000 mg via INTRAVENOUS

## 2019-02-25 MED ORDER — OXYCODONE-ACETAMINOPHEN 5-325 MG PO TABS: 1.0000 | ORAL_TABLET | Freq: Four times a day (QID) | ORAL | 0 refills | Status: AC | PRN

## 2019-02-25 MED ORDER — LACTATED RINGERS IV SOLN
INTRAVENOUS | Status: DC
  Administered 2019-02-25: 15:00:00 via INTRAVENOUS

## 2019-02-25 MED ORDER — HYDROMORPHONE HCL 1 MG/ML IJ SOLN
0.2500 mg | INTRAMUSCULAR | Status: DC | PRN
  Administered 2019-02-25 (×4): 0.5 mg via INTRAVENOUS

## 2019-02-25 MED ORDER — OXYCODONE HCL 5 MG/5ML PO SOLN: 5.0000 mg | Freq: Once | ORAL | Status: DC | PRN

## 2019-02-25 MED ORDER — BUPIVACAINE-EPINEPHRINE (PF) 0.5% -1:200000 IJ SOLN
INTRAMUSCULAR | Status: AC
  Filled 2019-02-25: qty 30

## 2019-02-25 MED ORDER — IBUPROFEN 800 MG PO TABS
800.0000 mg | ORAL_TABLET | Freq: Three times a day (TID) | ORAL | Status: DC
  Administered 2019-02-25 – 2019-02-26 (×3): 800 mg via ORAL
  Filled 2019-02-25 (×3): qty 1

## 2019-02-25 MED ORDER — OXYCODONE-ACETAMINOPHEN 5-325 MG PO TABS: 1.0000 | ORAL_TABLET | ORAL | Status: DC | PRN

## 2019-02-25 MED ORDER — FENTANYL CITRATE (PF) 250 MCG/5ML IJ SOLN
INTRAMUSCULAR | Status: AC
  Filled 2019-02-25: qty 5

## 2019-02-25 SURGICAL SUPPLY — 30 items
CANISTER SUCT 3000ML PPV (MISCELLANEOUS) ×3 IMPLANT
DECANTER SPIKE VIAL GLASS SM (MISCELLANEOUS) ×3 IMPLANT
ELECT BLADE 4.0 EZ CLEAN MEGAD (MISCELLANEOUS) ×3
ELECTRODE BLDE 4.0 EZ CLN MEGD (MISCELLANEOUS) ×1 IMPLANT
GAUZE 4X4 16PLY RFD (DISPOSABLE) ×3 IMPLANT
GLOVE BIO SURGEON STRL SZ 6.5 (GLOVE) ×2 IMPLANT
GLOVE BIO SURGEONS STRL SZ 6.5 (GLOVE) ×1
GLOVE BIOGEL PI IND STRL 6.5 (GLOVE) ×1 IMPLANT
GLOVE BIOGEL PI IND STRL 7.0 (GLOVE) ×3 IMPLANT
GLOVE BIOGEL PI INDICATOR 6.5 (GLOVE) ×2
GLOVE BIOGEL PI INDICATOR 7.0 (GLOVE) ×6
GLOVE NEODERM STER SZ 7 (GLOVE) ×3 IMPLANT
GOWN STRL REUS W/ TWL LRG LVL3 (GOWN DISPOSABLE) ×4 IMPLANT
GOWN STRL REUS W/TWL LRG LVL3 (GOWN DISPOSABLE) ×8
HEMOSTAT ARISTA ABSORB 3G PWDR (HEMOSTASIS) ×3 IMPLANT
KIT TURNOVER KIT B (KITS) ×3 IMPLANT
NEEDLE MAYO CATGUT SZ4 (NEEDLE) ×3 IMPLANT
NEEDLE SPNL 18GX3.5 QUINCKE PK (NEEDLE) ×3 IMPLANT
NS IRRIG 1000ML POUR BTL (IV SOLUTION) ×3 IMPLANT
PACK VAGINAL WOMENS (CUSTOM PROCEDURE TRAY) ×3 IMPLANT
PAD OB MATERNITY 4.3X12.25 (PERSONAL CARE ITEMS) ×3 IMPLANT
SPECIMEN JAR MEDIUM (MISCELLANEOUS) ×3 IMPLANT
SUT VIC AB 0 CT1 27 (SUTURE) ×4
SUT VIC AB 0 CT1 27XBRD ANBCTR (SUTURE) ×2 IMPLANT
SUT VIC AB 2-0 CT1 18 (SUTURE) ×12 IMPLANT
SUT VIC AB 2-0 CT1 27 (SUTURE) ×4
SUT VIC AB 2-0 CT1 TAPERPNT 27 (SUTURE) ×2 IMPLANT
TOWEL GREEN STERILE FF (TOWEL DISPOSABLE) ×6 IMPLANT
TRAY FOLEY W/BAG SLVR 14FR (SET/KITS/TRAYS/PACK) ×3 IMPLANT
UNDERPAD 30X30 (UNDERPADS AND DIAPERS) ×3 IMPLANT

## 2019-02-25 NOTE — Anesthesia Postprocedure Evaluation (Signed)
Anesthesia Post Note  Patient: [name]  Procedure(s) Performed: HYSTERECTOMY VAGINAL WITH SALPINGECTOMY (Bilateral Vagina )     Patient location during evaluation: PACU Anesthesia Type: General Level of consciousness: awake and alert Pain management: pain level controlled Vital Signs Assessment: post-procedure vital signs reviewed and stable Respiratory status: spontaneous breathing, nonlabored ventilation, respiratory function stable and patient connected to nasal cannula oxygen Cardiovascular status: blood pressure returned to baseline and stable Postop Assessment: no apparent nausea or vomiting Anesthetic complications: no    Last Vitals:  Vitals:   02/25/19 1016 02/25/19 1130  BP: (!) 127/91 118/85  Pulse: 82 90  Resp: 12 16  Temp:    SpO2: 99% 98%    Last Pain:  Vitals:   02/25/19 1130  TempSrc:   PainSc: [place] A Anina Schnake

## 2019-02-25 NOTE — Op Note (Signed)
02/25/2019  9:19 AM  PATIENT:  Michaela Patton  47 y.o. female  PRE-OPERATIVE DIAGNOSIS:  Menorrhagia, dysmenorrhea  POST-OPERATIVE DIAGNOSIS:  same  PROCEDURE:  Procedure(s): HYSTERECTOMY VAGINAL WITH SALPINGECTOMY (Bilateral)  SURGEON:  Surgeon(s) and Role:    * Hulan Fray, Wilhemina Cash, MD - Primary  ASSISTANTS: Wetzel Bjornstad, surgical tech   ANESTHESIA:   local and general  EBL:  400 mL   BLOOD ADMINISTERED:none  DRAINS: Urinary Catheter (Foley)   LOCAL MEDICATIONS USED:  MARCAINE     SPECIMEN:  Source of Specimen:  uterus and tubes  DISPOSITION OF SPECIMEN:  PATHOLOGY  COUNTS:  YES  TOURNIQUET:  * No tourniquets in log *  DICTATION: .Dragon Dictation  PLAN OF CARE: Admit for overnight observation  PATIENT DISPOSITION:  PACU - hemodynamically stable.   Delay start of Pharmacological VTE agent (>24hrs) due to surgical blood loss or risk of bleeding: not applicable  The risks, benefits, alternatives of surgery were explained, understood, and accepted. All questions were answered and a consent form was signed. She was taken to the operating room and general anesthesia was applied. She was put in the dorsal lithotomy position. Her vagina was prepped and draped in the usual sterile fashion. A timeout procedure was done. A bimanual exam revealed a 10 week size mobile fibroid uterus and non-enlarged adnexa. A Foley catheter was placed and it drained clear urine throughout the case. The cervix was grasped with a single-tooth tenaculum. A total of 110 cc of dilute Marcaine was injected in a circumferential fashion at the cervicovaginal junction. An incision was made at the site. The posterior peritoneum was entered. A long weighted speculum was placed. The anterior peritoneum was entered. A Deaver was placed anteriorly. The uterosacral ligaments were clamped, cut, and ligated. They were tagged and held. 2 Vicryl sutures used throughout this case unless otherwise specified. The uterus was  separated from its pelvic attachments using a similar clamp, cut, ligate technique. Excellent hemostasis was maintained. Due to its large size, I had to morcellate in order to have good visualization.  Once the uterus was removed, the bowel was kept out of the operative site with a sponge on a stick. I was able to see both ovaries and they appeared normal. I grasped the fimbriated end of each oviduct and grasped them with a Kelley clamp. I excised both. Both pedicles were ligated with 2-0 vicryl suture. Excellent hemostasis was noted. I placed Arista on the pedicles.  I closed the edges of the vaginal mucosa in a running, locking vertical fashion.Marland Kitchen She was extubated and taken to the recovery room in stable condition.

## 2019-02-25 NOTE — Anesthesia Procedure Notes (Signed)
Procedure Name: Intubation Date/Time: 02/25/2019 7:33 AM Performed by: Jenne Campus, CRNA Pre-anesthesia Checklist: Patient identified, Emergency Drugs available, Suction available and Patient being monitored Patient Re-evaluated:Patient Re-evaluated prior to induction Oxygen Delivery Method: Circle System Utilized Preoxygenation: Pre-oxygenation with 100% oxygen Induction Type: IV induction Ventilation: Mask ventilation without difficulty Laryngoscope Size: Miller and 2 Grade View: Grade I Tube type: Oral Tube size: 7.0 mm Number of attempts: 1 Airway Equipment and Method: Stylet and Oral airway Placement Confirmation: ETT inserted through vocal cords under direct vision,  positive ETCO2 and breath sounds checked- equal and bilateral Secured at: 21 cm Tube secured with: Tape Dental Injury: Teeth and Oropharynx as per pre-operative assessment

## 2019-02-25 NOTE — Progress Notes (Signed)
Dr. Hulan Fray is aware of patient's allergy to penicllin and states that Ancef can be used.

## 2019-02-25 NOTE — H&P (Signed)
Michaela Patton is a married 48 y.o. [id number] here today for a TVH/BS after u/s done for heavy and painful periods. She does have some pelvic pain without her period, about a 5 out 10. With her period the pain is a 10 out of 10. IBU helps some, if she takes 800 mg every 4 hours. Her u/s showed a small fibroid and possible adenomyosis. Her periods are so heavy that she wears a pad and a tampon. She bleeds for about 5 days.     Patient's last menstrual period was 02/07/2019.    Past Medical History:  Diagnosis Date  . Bipolar 1 disorder ([place])   . Edema   . Family history of adverse reaction to anesthesia    sister PONV  . GERD (gastroesophageal reflux disease)   . Headache   . Insomnia     Past Surgical History:  Procedure Laterality Date  . CHOLECYSTECTOMY    . PLACEMENT OF BREAST IMPLANTS Bilateral 2002  . TUBAL LIGATION      Family History  Problem Relation Age of Onset  . Breast cancer Sister     Social History:  reports that she has never smoked. She has never used smokeless tobacco. She reports previous alcohol use. She reports that she does not use drugs.  Allergies:  Allergies  Allergen Reactions  . Ciprofloxacin Nausea And Vomiting  . Codeine Itching  . Penicillins Hives    Did it involve swelling of the face/tongue/throat, SOB, or low BP? Unknown Did it involve sudden or severe rash/hives, skin peeling, or any reaction on the inside of your mouth or nose? Unknown Did you need to seek medical attention at a hospital or doctor's office? Unknown When did it last happen? childhood If all above answers are "NO", may proceed with cephalosporin use.   . Sulfa Antibiotics Hives    Medications Prior to Admission  Medication Sig Dispense Refill Last Dose  . furosemide (LASIX) 40 MG tablet Take 40 mg by mouth daily.   02/24/2019 at Unknown time  . LORazepam (ATIVAN) 2 MG tablet Take 2 mg by mouth 2 (two) times daily.  1 02/24/2019 at Unknown time  . QUEtiapine  (SEROQUEL) 400 MG tablet Take 800 mg by mouth at bedtime.   1 02/24/2019 at Unknown time  . ranitidine (ZANTAC) 150 MG tablet Take 1 tablet (150 mg total) by mouth 2 (two) times daily. 60 tablet 0   . zolpidem (AMBIEN CR) 12.5 MG CR tablet Take 12.5 mg by mouth at bedtime.    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 She denies dyspareunia. She has some stress urinary incontinence.  Blood pressure (!) 145/87, pulse 85, temperature 98.6 F (37 C), temperature source Tympanic, resp. rate 18, height 5\' 4"  (1.626 m), weight 88.5 kg, last menstrual period 02/07/2019, SpO2 100 %. Physical Exam  Breathing, conversing, and ambulating normally Well nourished, well hydrated White female, no apparent distress Heart- rrr Lungs- CTAB Abd- benign  Results for orders placed or performed during the hospital encounter of 02/25/19 (from the past 24 hour(s))  Basic metabolic panel     Status: Abnormal   Collection Time: 02/25/19  5:55 AM  Result Value Ref Range   Sodium 137 135 - 145 mmol/L   Potassium 3.0 (L) 3.5 - 5.1 mmol/L   Chloride 101 98 - 111 mmol/L   CO2 26 22 - 32 mmol/L   Glucose, Bld 94 70 - 99 mg/dL   BUN 8 6 -  20 mg/dL   Creatinine, Ser 0.87 0.44 - 1.00 mg/dL   Calcium 8.9 8.9 - 10.3 mg/dL   GFR calc non Af Amer >60 >60 mL/min   GFR calc Af Amer >60 >60 mL/min   Anion gap 10 5 - 15  Pregnancy, urine POC     Status: None   Collection Time: 02/25/19  6:41 AM  Result Value Ref Range   Preg Test, Ur NEGATIVE NEGATIVE    No results found.  Assessment/Plan: Dysmenorrhea, menorrhagia, fibroid, possible adenomyosis- plan for TVH/BS.  She understands the risks of surgery, including, but not to infection, bleeding, DVTs, damage to bowel, bladder, ureters. She wishes to proceed.        Emily Filbert 02/25/2019, 7:13 AM

## 2019-02-25 NOTE — Discharge Summary (Signed)
Physician Discharge Summary  Patient ID: Michaela Patton MRN: [id number] DOB/AGE: 1971/05/18 48 y.o.  Admit date: 02/25/2019 Discharge date: 02/26/2019  Admission Diagnoses: menorrhagia, dysmenorrhea  Discharge Diagnoses: same Active Problems:   Post-operative state   Discharged Condition: good  Hospital Course: She underwent an uncomplicated TVH/BS. By POD #1 she was tolerating po well, ambulating, and voiding. She voiced her readiness to go home.  Consults: None  Significant Diagnostic Studies: labs: none  Treatments: surgery: TVH/BS  Discharge Exam: Blood pressure 112/78, pulse 88, temperature 97.9 F (36.6 C), temperature source Oral, resp. rate 18, height 5\' 4"  (1.626 m), weight 88.5 kg, last menstrual period 02/07/2019, SpO2 99 %. General appearance: alert Resp: clear to auscultation bilaterally Cardio: regular rate and rhythm, S1, S2 normal, no murmur, click, rub or gallop GI: soft, non-tender; bowel sounds normal; no masses,  no organomegaly  Disposition: Discharge disposition: 01-Home or Self Care     home  Discharge Instructions    Call MD for:  difficulty breathing, headache or visual disturbances   Complete by: As directed    Call MD for:  persistant nausea and vomiting   Complete by: As directed    Call MD for:  severe uncontrolled pain   Complete by: As directed    Call MD for:  temperature >100.4   Complete by: As directed    Diet - low sodium heart healthy   Complete by: As directed    Increase activity slowly   Complete by: As directed     New meds: percocet #30 and IBU 600 mg   Follow-up Information    Emily Filbert, MD. Schedule an appointment as soon as possible for a visit in 3 weeks.   Specialty: Obstetrics and Gynecology Contact information: [address] [place] [address] [phone]           Signed: Truett Mainland 02/26/2019, 7:15 AM

## 2019-02-25 NOTE — Discharge Instructions (Signed)
Vaginal Hysterectomy, Care After °Refer to this sheet in the next few weeks. These instructions provide you with information about caring for yourself after your procedure. Your health care provider may also give you more specific instructions. Your treatment has been planned according to current medical practices, but problems sometimes occur. Call your health care provider if you have any problems or questions after your procedure. °What can I expect after the procedure? °After the procedure, it is common to have: °· Pain. °· Soreness and numbness in your incision areas. °· Vaginal bleeding and discharge. °· Constipation. °· Temporary problems emptying the bladder. °· Feelings of sadness or other emotions. °Follow these instructions at home: °Medicines °· Take over-the-counter and prescription medicines only as told by your health care provider. °· If you were prescribed an antibiotic medicine, take it as told by your health care provider. Do not stop taking the antibiotic even if you start to feel better. °· Do not drive or operate heavy machinery while taking prescription pain medicine. °Activity °· Return to your normal activities as told by your health care provider. Ask your health care provider what activities are safe for you. °· Get regular exercise as told by your health care provider. You may be told to take short walks every day and go farther each time. °· Do not lift anything that is heavier than 10 lb (4.5 kg). °General instructions ° °· Do not put anything in your vagina for 6 weeks after your surgery or as told by your health care provider. This includes tampons and douches. °· Do not have sex until your health care provider says you can. °· Do not take baths, swim, or use a hot tub until your health care provider approves. °· Drink enough fluid to keep your urine clear or pale yellow. °· Do not drive for 24 hours if you were given a sedative. °· Keep all follow-up visits as told by your health  care provider. This is important. °Contact a health care provider if: °· Your pain medicine is not helping. °· You have a fever. °· You have redness, swelling, or pain at your incision site. °· You have blood, pus, or a bad-smelling discharge from your vagina. °· You continue to have difficulty urinating. °Get help right away if: °· You have severe abdominal or back pain. °· You have heavy bleeding from your vagina. °· You have chest pain or shortness of breath. °This information is not intended to replace advice given to you by your health care provider. Make sure you discuss any questions you have with your health care provider. °Document Released: 09/13/2015 Document Revised: 01/13/2016 Document Reviewed: 06/06/2015 °Elsevier Patient Education © 2020 [employer] ° °

## 2019-02-25 NOTE — Transfer of Care (Signed)
Immediate Anesthesia Transfer of Care Note  Patient: Michaela Patton  Procedure(s) Performed: HYSTERECTOMY VAGINAL WITH SALPINGECTOMY (Bilateral Vagina )  Patient Location: PACU  Anesthesia Type:General  Level of Consciousness: sedated and patient cooperative  Airway & Oxygen Therapy: Patient Spontanous Breathing and Patient connected to nasal cannula oxygen  Post-op Assessment: Report given to RN and Post -op Vital signs reviewed and stable  Post vital signs: Reviewed  Last Vitals:  Vitals Value Taken Time  BP    Temp    Pulse 90 02/25/19 0940  Resp 14 02/25/19 0940  SpO2 96 % 02/25/19 0940  Vitals shown include unvalidated device data.  Last Pain:  Vitals:   02/25/19 0619  TempSrc: Tympanic  PainSc: 0-No pain      Patients Stated Pain Goal: 3 ([id number])  Complications: No apparent anesthesia complications

## 2019-02-26 ENCOUNTER — Encounter (HOSPITAL_COMMUNITY): Payer: Self-pay | Admitting: Obstetrics & Gynecology

## 2019-02-26 DIAGNOSIS — N920 Excessive and frequent menstruation with regular cycle: Secondary | ICD-10-CM | POA: Diagnosis not present

## 2019-02-26 LAB — SURGICAL PATHOLOGY

## 2019-02-26 LAB — CBC
HCT: 30.8 % — ABNORMAL LOW (ref 36.0–46.0)
Hemoglobin: 10.5 g/dL — ABNORMAL LOW (ref 12.0–15.0)
MCH: 29.5 pg (ref 26.0–34.0)
MCHC: 34.1 g/dL (ref 30.0–36.0)
MCV: 86.5 fL (ref 80.0–100.0)
Platelets: 332 10*3/uL (ref 150–400)
RBC: 3.56 MIL/uL — ABNORMAL LOW (ref 3.87–5.11)
RDW: 13.2 % (ref 11.5–15.5)
WBC: 10.6 10*3/uL — ABNORMAL HIGH (ref 4.0–10.5)
nRBC: 0 % (ref 0.0–0.2)

## 2019-02-26 MED ORDER — DOCUSATE SODIUM 100 MG PO CAPS: 100.0000 mg | ORAL_CAPSULE | Freq: Two times a day (BID) | ORAL | 2 refills | Status: AC | PRN

## 2019-02-26 NOTE — Plan of Care (Signed)

## 2019-02-26 NOTE — Progress Notes (Signed)
Pt ready to go home.  DC instructions given and reviewed.  Pt understands follow up appt and meds.

## 2019-02-26 NOTE — Progress Notes (Signed)
Called on call MD for some TUMS due to patient complaining of heartburn.

## 2019-03-24 ENCOUNTER — Encounter: Payer: Self-pay | Admitting: Obstetrics & Gynecology

## 2019-03-24 ENCOUNTER — Ambulatory Visit (INDEPENDENT_AMBULATORY_CARE_PROVIDER_SITE_OTHER): Payer: BC Managed Care – PPO | Admitting: Obstetrics & Gynecology

## 2019-03-24 ENCOUNTER — Other Ambulatory Visit: Payer: Self-pay

## 2019-03-24 VITALS — BP 132/95 | HR 86 | Ht 64.0 in | Wt 199.0 lb

## 2019-03-24 DIAGNOSIS — Z9889 Other specified postprocedural states: Secondary | ICD-10-CM

## 2019-03-24 NOTE — Progress Notes (Signed)
   Subjective:    Patient ID: Michaela Patton, female    DOB: Aug 06, 1970, 48 y.o.   MRN: [id number]  HPI 48 yo lady here for a post op visit. She had a TVH/BS on 02/25/19. She feels better each day, never took any narcotics, only IBU and tylenol. She reports normal bowel and bladder function. She has not had sex since surgery. Her pathology showed fibroids and adenomyosis.  Review of Systems Mammogram in [place], UTD    Objective:   Physical Exam Breathing, conversing, and ambulating normally Well nourished, well hydrated White female, no apparent distress Abd- benign Spec exam- healed cuff, normal discharge Bimanual exam- no masses or tenderness     Assessment & Plan:  Post op state- doing well

## 2019-09-19 IMAGING — US TRANSVAGINAL ULTRASOUND OF PELVIS
1 series · 14 of 25 positions shown · non-contrast
Comparison: None

CLINICAL DATA: Menorrhagia with regular cycle

EXAM:
ULTRASOUND PELVIS TRANSVAGINAL
TECHNIQUE: Transvaginal ultrasound examination of the pelvis was performed
including evaluation of the uterus, ovaries, adnexal regions, and
pelvic cul-de-sac.

[Series 1: transvaginal ultrasound of pelvis · 134 acquisitions, 14 frames shown]
[im 1/134]
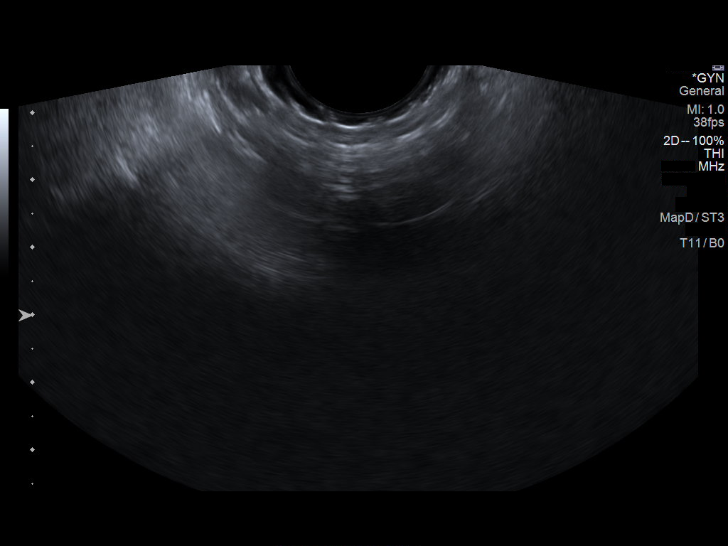
[im 12/134]
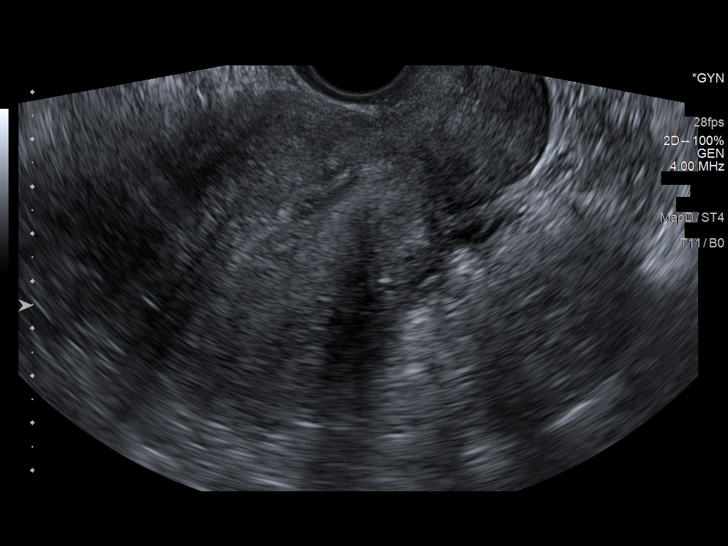
[im 23/134]
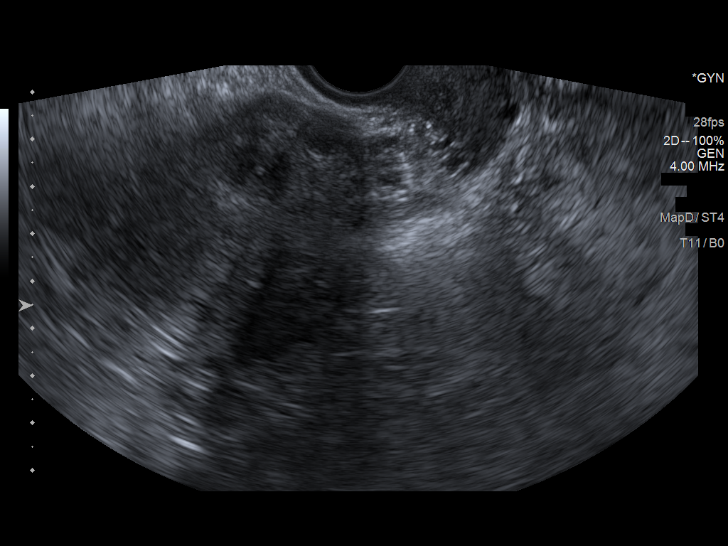
[im 34/134]
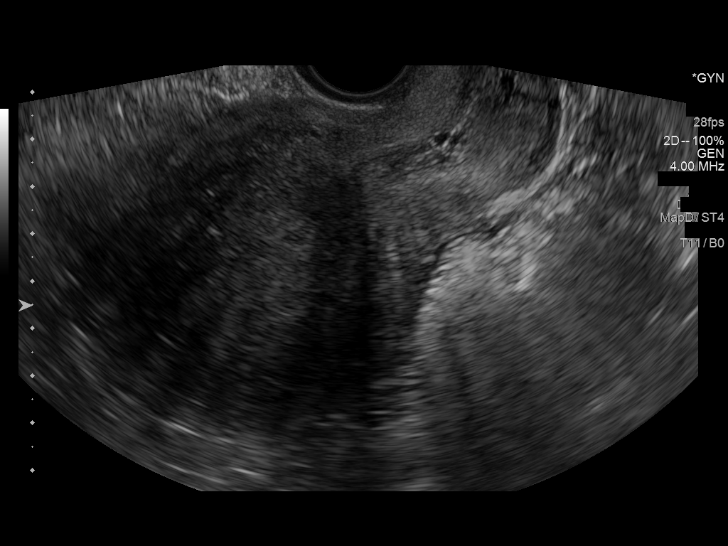
[im 45/134]
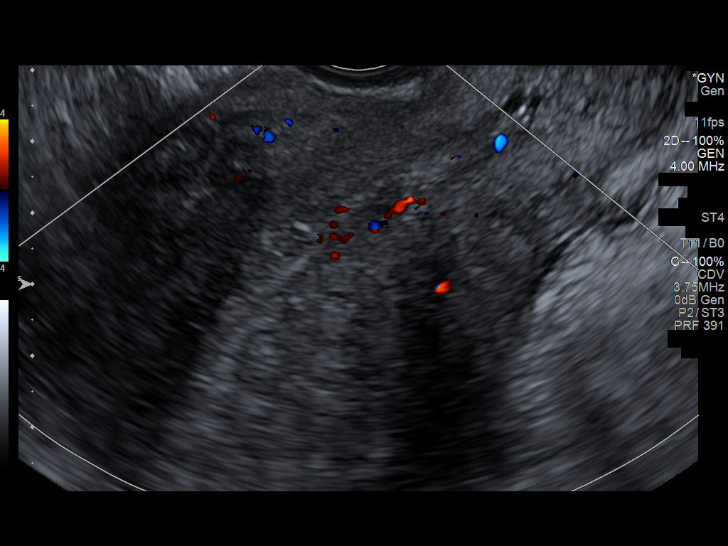
[im 50/134]
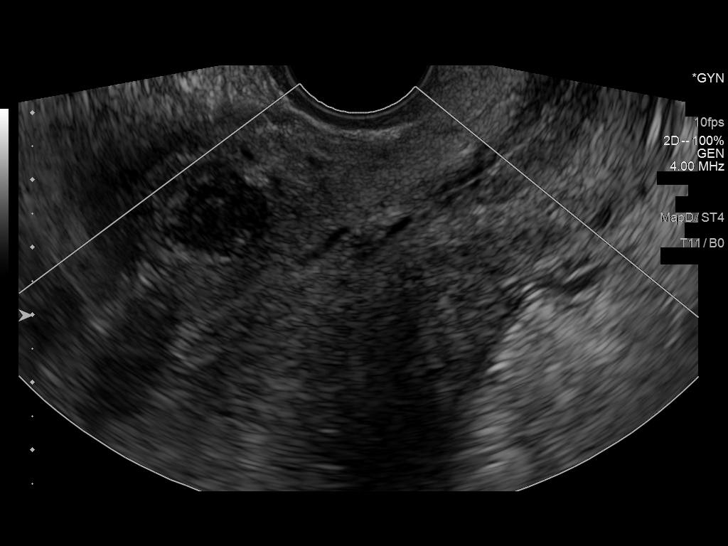
[im 61/134]
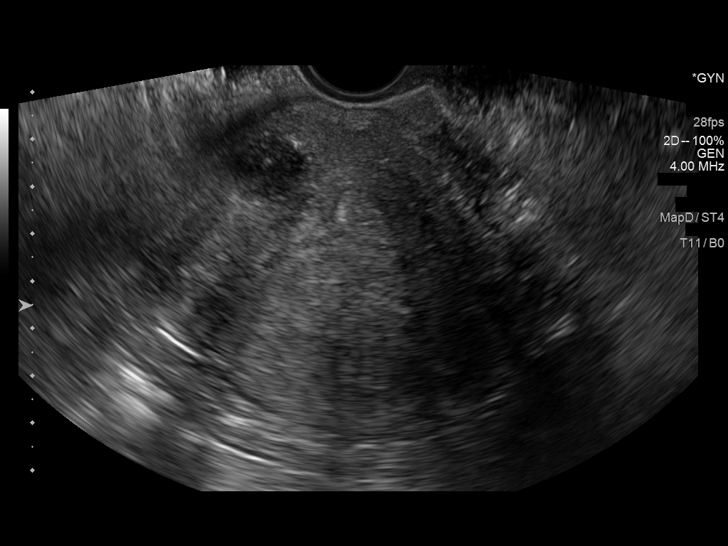
[im 73/134]
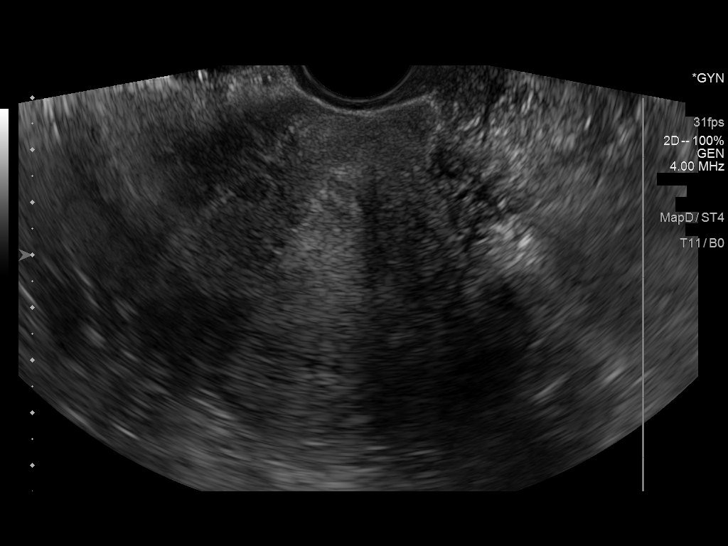
[im 84/134]
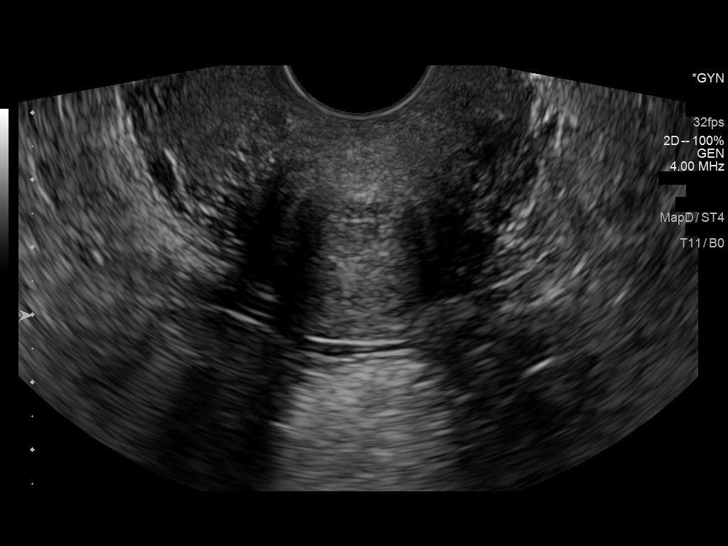
[im 89/134]
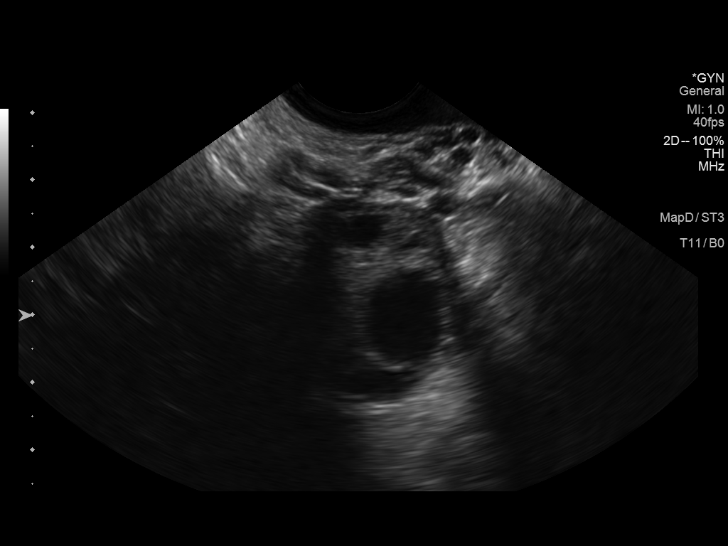
[im 100/134]
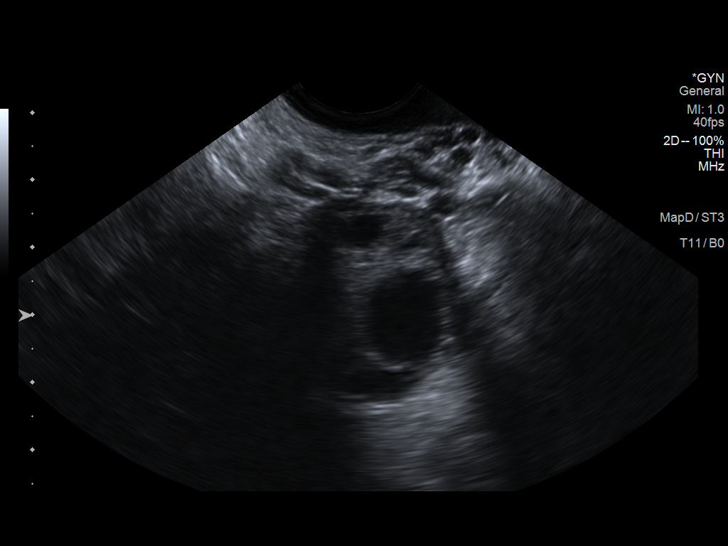
[im 111/134]
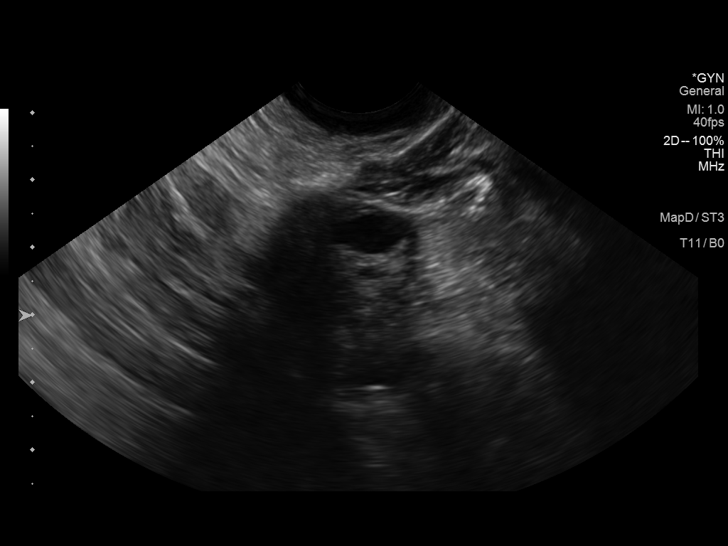
[im 122/134]
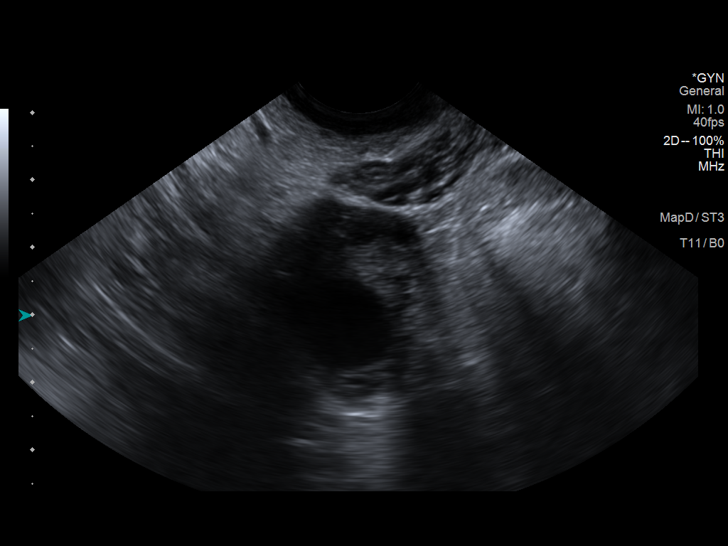
[im 134/134]
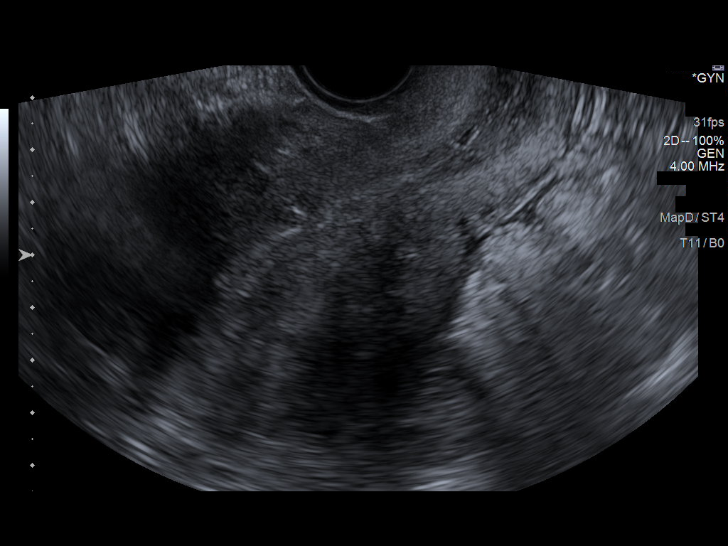

[14 of 25 positions shown; findings below may reference images not displayed]

FINDINGS: Uterus

Measurements: 11.4 x 6.6 x 7.2 cm = volume: 283 mL. Anteverted.
Heterogeneous myometrium with ill definition of the basilar layer of
the endometrium, cannot exclude adenomyosis. Small anterior wall
intramural leiomyoma 1.5 x 1.5 x 1.5 cm. No additional discrete
uterine masses

Endometrium

Thickness: 7 mm.  No endometrial fluid or focal abnormalities

Right ovary

Measurements: 2.3 x 3.0 x 2.0 cm = volume: 7 mL. Dominant follicle
without mass

Left ovary

Not visualized on either transabdominal or endovaginal imaging,
likely obscured by bowel

Other findings:  No free pelvic fluid.  No adnexal masses.
IMPRESSION: Single small anterior wall intramural leiomyoma of the uterus 1.5 cm
diameter.

Heterogeneous myometrium with ill defined junction with the
endometrial complex, cannot exclude adenomyosis.

Nonvisualization of LEFT ovary.

Remainder of exam unremarkable.
